# Patient Record
Sex: Male | Born: 1945 | Race: White | Hispanic: No | Marital: Married | Smoking: Never smoker
Health system: Southern US, Community
[De-identification: ages and names within clinical notes are randomized; demographics above are authoritative.]

## PROBLEM LIST (undated history)

## (undated) DIAGNOSIS — K449 Diaphragmatic hernia without obstruction or gangrene: Secondary | ICD-10-CM

## (undated) DIAGNOSIS — K649 Unspecified hemorrhoids: Secondary | ICD-10-CM

## (undated) DIAGNOSIS — E785 Hyperlipidemia, unspecified: Secondary | ICD-10-CM

## (undated) DIAGNOSIS — D509 Iron deficiency anemia, unspecified: Secondary | ICD-10-CM

## (undated) DIAGNOSIS — R739 Hyperglycemia, unspecified: Secondary | ICD-10-CM

## (undated) DIAGNOSIS — N320 Bladder-neck obstruction: Secondary | ICD-10-CM

## (undated) DIAGNOSIS — R634 Abnormal weight loss: Secondary | ICD-10-CM

## (undated) DIAGNOSIS — R740 Nonspecific elevation of levels of transaminase and lactic acid dehydrogenase [LDH]: Secondary | ICD-10-CM

## (undated) DIAGNOSIS — R7401 Elevation of levels of liver transaminase levels: Secondary | ICD-10-CM

## (undated) DIAGNOSIS — E669 Obesity, unspecified: Secondary | ICD-10-CM

## (undated) DIAGNOSIS — I10 Essential (primary) hypertension: Secondary | ICD-10-CM

## (undated) DIAGNOSIS — R1110 Vomiting, unspecified: Secondary | ICD-10-CM

## (undated) DIAGNOSIS — K760 Fatty (change of) liver, not elsewhere classified: Secondary | ICD-10-CM

## (undated) DIAGNOSIS — K219 Gastro-esophageal reflux disease without esophagitis: Secondary | ICD-10-CM

## (undated) DIAGNOSIS — R109 Unspecified abdominal pain: Secondary | ICD-10-CM

## (undated) DIAGNOSIS — K759 Inflammatory liver disease, unspecified: Secondary | ICD-10-CM

## (undated) DIAGNOSIS — R17 Unspecified jaundice: Secondary | ICD-10-CM

## (undated) DIAGNOSIS — M1990 Unspecified osteoarthritis, unspecified site: Secondary | ICD-10-CM

## (undated) DIAGNOSIS — I499 Cardiac arrhythmia, unspecified: Secondary | ICD-10-CM

## (undated) DIAGNOSIS — R6881 Early satiety: Secondary | ICD-10-CM

## (undated) DIAGNOSIS — K5790 Diverticulosis of intestine, part unspecified, without perforation or abscess without bleeding: Secondary | ICD-10-CM

## (undated) DIAGNOSIS — D751 Secondary polycythemia: Secondary | ICD-10-CM

## (undated) DIAGNOSIS — R1310 Dysphagia, unspecified: Secondary | ICD-10-CM

## (undated) HISTORY — DX: Essential (primary) hypertension: I10

## (undated) HISTORY — DX: Diverticulosis of intestine, part unspecified, without perforation or abscess without bleeding: K57.90

## (undated) HISTORY — DX: Vomiting, unspecified: R11.10

## (undated) HISTORY — DX: Bladder-neck obstruction: N32.0

## (undated) HISTORY — PX: CYST REMOVAL HAND: SHX6279

## (undated) HISTORY — DX: Obesity, unspecified: E66.9

## (undated) HISTORY — PX: CATARACT EXTRACTION, BILATERAL: SHX1313

## (undated) HISTORY — PX: OTHER SURGICAL HISTORY: SHX169

## (undated) HISTORY — DX: Abnormal weight loss: R63.4

## (undated) HISTORY — DX: Inflammatory liver disease, unspecified: K75.9

## (undated) HISTORY — PX: APPENDECTOMY: SHX54

## (undated) HISTORY — DX: Secondary polycythemia: D75.1

## (undated) HISTORY — DX: Unspecified jaundice: R17

## (undated) HISTORY — DX: Unspecified abdominal pain: R10.9

## (undated) HISTORY — PX: CHOLECYSTECTOMY: SHX55

## (undated) HISTORY — PX: TRANSURETHRAL RESECTION OF PROSTATE: SHX73

## (undated) HISTORY — PX: UPPER GASTROINTESTINAL ENDOSCOPY: SHX188

## (undated) HISTORY — DX: Early satiety: R68.81

## (undated) HISTORY — DX: Diaphragmatic hernia without obstruction or gangrene: K44.9

## (undated) HISTORY — PX: COLON RESECTION: SHX5231

## (undated) HISTORY — DX: Dysphagia, unspecified: R13.10

## (undated) HISTORY — DX: Hyperlipidemia, unspecified: E78.5

## (undated) HISTORY — DX: Hyperglycemia, unspecified: R73.9

## (undated) HISTORY — DX: Iron deficiency anemia, unspecified: D50.9

## (undated) HISTORY — DX: Nonspecific elevation of levels of transaminase and lactic acid dehydrogenase (ldh): R74.0

## (undated) HISTORY — DX: Unspecified hemorrhoids: K64.9

## (undated) HISTORY — DX: Fatty (change of) liver, not elsewhere classified: K76.0

## (undated) HISTORY — DX: Elevation of levels of liver transaminase levels: R74.01

## (undated) HISTORY — DX: Gastro-esophageal reflux disease without esophagitis: K21.9

---

## 2002-07-12 HISTORY — PX: SHOULDER OPEN ROTATOR CUFF REPAIR: SHX2407

## 2012-07-24 HISTORY — PX: COLONOSCOPY: SHX174

## 2013-12-27 ENCOUNTER — Ambulatory Visit (INDEPENDENT_AMBULATORY_CARE_PROVIDER_SITE_OTHER): Payer: Medicare Other | Admitting: Internal Medicine

## 2013-12-27 ENCOUNTER — Encounter (INDEPENDENT_AMBULATORY_CARE_PROVIDER_SITE_OTHER): Payer: Self-pay | Admitting: *Deleted

## 2013-12-27 ENCOUNTER — Encounter (INDEPENDENT_AMBULATORY_CARE_PROVIDER_SITE_OTHER): Payer: Self-pay | Admitting: Internal Medicine

## 2013-12-27 VITALS — BP 108/72 | HR 76 | Temp 98.1°F | Resp 18 | Ht 75.0 in | Wt 226.6 lb

## 2013-12-27 DIAGNOSIS — K7689 Other specified diseases of liver: Secondary | ICD-10-CM

## 2013-12-27 DIAGNOSIS — R7401 Elevation of levels of liver transaminase levels: Secondary | ICD-10-CM

## 2013-12-27 DIAGNOSIS — R740 Nonspecific elevation of levels of transaminase and lactic acid dehydrogenase [LDH]: Secondary | ICD-10-CM

## 2013-12-27 NOTE — Patient Instructions (Signed)
Liver biopsy as scheduled. Physician will call with results of liver biopsy and further recommendations.

## 2013-12-27 NOTE — Progress Notes (Addendum)
Presenting complaint;  Elevated transaminases.  History of present illness;  Patient is 68 year old Caucasian male who is referred to courtesy of Dr. Drue Stager of [place], [place] for GI evaluation and possible ERCP. Patient is accompanied by his wife today. Patient has history of polycythemia vera and last month and to see his oncologist for scheduled visit. He had lab studies and his LFTs were abnormal. He was therefore referred for GI evaluation. He was seen by Dr.Spainhour. LFTs were repeated along with ultrasound followed by CT and finally MRCP. He was noted to have wall thickening to CBD and CHD along with narrowing as well as segmental narrowing to intrahepatic ducts with focal dilation. Patient states he hasn't felt well for the past few months. He has had extreme fatigue. He is been nauseated intermittently but never vomited. He also has been noticing pruritus over the last 3 months. He says his stool is lighter in color at times causes no change in urine color although he thinks he may be passing less urine. He has noted decrease in his appetite and has lost 5 pounds. He also is noted bloating and heaviness or tightness in right upper quadrant. He has had night sweats but denies fever or chills. 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 He had blood work in September last year revealing positive core antibody and negative hepatitis B surface antigen. Does not take any herbal medications. He was on aspirin and omega III fish oil which was stopped over 2 weeks ago. Heartburn is well-controlled with PPI.  Current Medications: Outpatient Encounter Prescriptions as of 12/27/2013  Medication Sig  . Cholecalciferol (VITAMIN D3) 1000 UNITS CAPS Take by mouth daily.  . Cyanocobalamin (NASCOBAL) 500 MCG/0.1ML SOLN Place into the nose once a week.  Marland Kitchen dexlansoprazole (DEXILANT) 60 MG capsule Take 60 mg by mouth daily.  . ferrous sulfate 325 (65 FE) MG EC  tablet Take 325 mg by mouth. Patient takes this by mouth every other day  . nebivolol (BYSTOLIC) 2.5 MG tablet Take 2.5 mg by mouth daily.  . tamsulosin (FLOMAX) 0.4 MG CAPS capsule Take 0.4 mg by mouth daily.  Marland Kitchen aspirin 81 MG tablet Take 81 mg by mouth daily.  . Omega-3 Fatty Acids (FISH OIL) 1000 MG CAPS Take by mouth daily.   Past medical history; Chronic GERD. Status post repair for hiatal hernia at age 45. He also had esophagus dilated at the same time. Hypertension. Hyperlipidemia. History of colonic polyps; last colonoscopy January 2014 revealing diverticulosis. Polycythemia vera diagnosed 10 years ago; requiring periodic phlebotomies. History of hepatitis B at age 61; hepatitis B surface antigen negative and hepatitis B core antibody total positive September 2014 Hyperglycemia. History of B12 and iron deficiency. History of benign positional vertigo. Right knee arthritis. Status post surgery at age 47 and more recently arthroscopy last year. Pilonidal cyst removed at age 57. Appendectomy at age 11. Right shoulder surgery for rotator cuff repair at age 44. Status post resection of sigmoid colon for diverticulitis at age 58. Cholecystectomy for cholelithiasis at age 59. Right cataract extraction at age 20 and left one in 50. Laser surgery for BPH at age 72.  Allergies; Allergies  Allergen Reactions  . Biaxin [Clarithromycin]     Sick Reaction Type: Adverse Allergy  . Vytorin [Ezetimibe-Simvastatin]     Myalgias Reaction type   Family history; Father died of alcoholic cirrhosis at age 64. Mother died of COPD at age 68. Sister died last week  at age 59 secondary to [place]).  Social history; Patient is married and accompanied by his wife today. They have two grown up children. He works as an Paramedic from the knee and retired in 1996. He has never smoked cigarettes and drinks alcohol occasionally.   Physical examination; Blood pressure 108/72, pulse 76,  temperature 98.1 F (36.7 C), temperature source Oral, resp. rate 18, height 6\' 3"  (1.905 m), weight 226 lb 9.6 oz (102.785 kg). Patient is alert and does not have asterixis. Conjunctiva is pink. Sclera is nonicteric Oropharyngeal mucosa is normal. No neck masses or thyromegaly noted. Cardiac exam with regular rhythm normal S1 and S2. No murmur or gallop noted. Lungs are clear to auscultation. Abdomen is full. Bowel sounds are normal. On palpation abdomen is soft. Liver edge is 7 cm below RCM; it is firm and mildly tender; span is about 16 cm. Spleen is not palpable. No LE edema or clubbing noted.  Labs/studies Results: LFTs at 06/05/2013. Bilirubin 0.9, AP 101, AST 42, ALT 74 and albumin 3.8  LFTs on 09/10/2013 Bilirubin 1.0, AP 159, AST 48, ALT 108 and albumin 3.7  LFTs on 12/04/2013 Bilirubin 1.0, AP 399, AST 92, ALT 111 and albumin 3.2.  LFTs on 12/18/2013 Bili 1.1, AP 370, AST 134, ALT 101 and albumin 3.3.  Lab data on 12/04/2013 Serum sodium 140, potassium 4.3, chloride 106, CO2 29, BUN 15 and creatinine 0.92 Glucose 140 and serum calcium 8.6 Serum ferritin 62.7 WBC 5.8, H&H 15.2 and 48.1 and platelet count 245K.  Lab data on 12/24/2013(studies were ordered by me). Sed rate 6 AMA 4.9 which is negative ANA negative smooth muscle antibody 11 which is within normal limits IgG subclass 4   96 mg/dL which is within normal limits  Imaging studies;     [place] on 12/10/2013.       Liver measured 19 cm and revealed increased                       echogenecity.      CBD 27mm. gallbladder has been removed.    Abdominal CT with contrast on 12/14/2013.       No abnormality noted 2 liver; slight prominence to the              intrahepatic biliary radicals but normal size CBD.        Moderate-sized sliding hiatal hernia and bilateral renal             parapelvic cysts.   MRI abdomen without contrast on 12/19/2013.        Liver size is normal. Slight prominence noted to                        intrahepatic bile ducts. Common hepatic duct and                   common bile duct shows mild diffuse narrowing. There           is suggestion of thickening throughout the wall of                      common hepatic and bile duct measuring 4 mm the                 distally CBD measures 7 mm at the level of pancreas.            Small to moderate size  sliding hiatal hernia.      I have reviewed and MRCP with Dr. Lavonia Dana. There is         focal dilation and narrowing to intrahepatic biliary radicals      in addition to above   Assessment:  Patient is 68 year old Caucasian male who presents with  few months history of fatigue nausea pruritus dull pain at right upper quadrant and elevated transaminases with cholestatic pattern. He has remote history of hepatitis B and markers last September confirmed recovery with immunity. Imaging studies reveal diffuse narrowing to extrahepatic bile duct as well as focal dilation and narrowing to intrahepatic ducts. He also has evidence of wall thickening to intra-and extrahepatic biliary system. Biochemical markers for autoimmune process are negative. Patient's presentation nevertheless is worrisome for autoimmune cholangitis or nonsuppurative cholangitis(PBC). Similarly sarcoidosis would be in differential diagnosis but he has no evidence of lymphadenopathy. Other possibilities would include [place] but he does not have inflammatory bowel disease as well as drug-induced biliary tract disease. I do not believe ERCP would provide critical information as to the diagnosis and its management. Therefore I would recommend liver biopsy in an effort to make definite diagnosis. He has history of polycythemia vera requiring periodic phlebotomies. I doubt that there is an association between this condition and bile duct disease.    Recommendations;  Ultrasound-guided liver biopsy on 12/31/2013. Further recommendations will be made as soon as liver biopsy results are  available.

## 2013-12-28 ENCOUNTER — Encounter (HOSPITAL_COMMUNITY): Payer: Self-pay | Admitting: Pharmacist

## 2013-12-28 ENCOUNTER — Other Ambulatory Visit: Payer: Self-pay | Admitting: Radiology

## 2013-12-28 DIAGNOSIS — K219 Gastro-esophageal reflux disease without esophagitis: Secondary | ICD-10-CM | POA: Insufficient documentation

## 2013-12-28 DIAGNOSIS — D45 Polycythemia vera: Secondary | ICD-10-CM | POA: Insufficient documentation

## 2013-12-28 DIAGNOSIS — I10 Essential (primary) hypertension: Secondary | ICD-10-CM | POA: Insufficient documentation

## 2013-12-28 DIAGNOSIS — K831 Obstruction of bile duct: Secondary | ICD-10-CM | POA: Insufficient documentation

## 2013-12-31 ENCOUNTER — Ambulatory Visit (HOSPITAL_COMMUNITY): Payer: Medicare Other

## 2013-12-31 ENCOUNTER — Ambulatory Visit (HOSPITAL_COMMUNITY): Admission: RE | Admit: 2013-12-31 | Payer: Medicare Other | Source: Ambulatory Visit

## 2013-12-31 ENCOUNTER — Ambulatory Visit (HOSPITAL_COMMUNITY)
Admission: RE | Admit: 2013-12-31 | Discharge: 2013-12-31 | Disposition: A | Discharge status: Home / Self Care | Payer: Medicare Other | Source: Ambulatory Visit | Attending: Internal Medicine | Admitting: Internal Medicine

## 2013-12-31 ENCOUNTER — Encounter (HOSPITAL_COMMUNITY): Payer: Self-pay

## 2013-12-31 DIAGNOSIS — R7402 Elevation of levels of lactic acid dehydrogenase (LDH): Secondary | ICD-10-CM | POA: Insufficient documentation

## 2013-12-31 DIAGNOSIS — R7309 Other abnormal glucose: Secondary | ICD-10-CM | POA: Insufficient documentation

## 2013-12-31 DIAGNOSIS — K7689 Other specified diseases of liver: Secondary | ICD-10-CM | POA: Insufficient documentation

## 2013-12-31 DIAGNOSIS — R740 Nonspecific elevation of levels of transaminase and lactic acid dehydrogenase [LDH]: Principal | ICD-10-CM

## 2013-12-31 DIAGNOSIS — K5710 Diverticulosis of small intestine without perforation or abscess without bleeding: Secondary | ICD-10-CM | POA: Insufficient documentation

## 2013-12-31 DIAGNOSIS — I10 Essential (primary) hypertension: Secondary | ICD-10-CM | POA: Insufficient documentation

## 2013-12-31 DIAGNOSIS — R748 Abnormal levels of other serum enzymes: Secondary | ICD-10-CM | POA: Insufficient documentation

## 2013-12-31 DIAGNOSIS — Z7982 Long term (current) use of aspirin: Secondary | ICD-10-CM | POA: Insufficient documentation

## 2013-12-31 DIAGNOSIS — E785 Hyperlipidemia, unspecified: Secondary | ICD-10-CM | POA: Insufficient documentation

## 2013-12-31 DIAGNOSIS — D45 Polycythemia vera: Secondary | ICD-10-CM | POA: Insufficient documentation

## 2013-12-31 DIAGNOSIS — K5730 Diverticulosis of large intestine without perforation or abscess without bleeding: Secondary | ICD-10-CM | POA: Insufficient documentation

## 2013-12-31 DIAGNOSIS — K219 Gastro-esophageal reflux disease without esophagitis: Secondary | ICD-10-CM | POA: Insufficient documentation

## 2013-12-31 DIAGNOSIS — K759 Inflammatory liver disease, unspecified: Secondary | ICD-10-CM | POA: Insufficient documentation

## 2013-12-31 DIAGNOSIS — K449 Diaphragmatic hernia without obstruction or gangrene: Secondary | ICD-10-CM | POA: Insufficient documentation

## 2013-12-31 DIAGNOSIS — D509 Iron deficiency anemia, unspecified: Secondary | ICD-10-CM | POA: Insufficient documentation

## 2013-12-31 DIAGNOSIS — E669 Obesity, unspecified: Secondary | ICD-10-CM | POA: Insufficient documentation

## 2013-12-31 DIAGNOSIS — N320 Bladder-neck obstruction: Secondary | ICD-10-CM | POA: Insufficient documentation

## 2013-12-31 DIAGNOSIS — Z8619 Personal history of other infectious and parasitic diseases: Secondary | ICD-10-CM | POA: Insufficient documentation

## 2013-12-31 DIAGNOSIS — R7401 Elevation of levels of liver transaminase levels: Secondary | ICD-10-CM | POA: Insufficient documentation

## 2013-12-31 HISTORY — PX: LIVER BIOPSY: SHX301

## 2013-12-31 LAB — PROTIME-INR
INR: 1.01 (ref 0.00–1.49)
Prothrombin Time: 13.1 seconds (ref 11.6–15.2)

## 2013-12-31 LAB — CBC
HCT: 49.6 % (ref 39.0–52.0)
Hemoglobin: 16.9 g/dL (ref 13.0–17.0)
MCH: 30 pg (ref 26.0–34.0)
MCHC: 34.1 g/dL (ref 30.0–36.0)
MCV: 88.1 fL (ref 78.0–100.0)
Platelets: 236 10*3/uL (ref 150–400)
RBC: 5.63 MIL/uL (ref 4.22–5.81)
RDW: 13.7 % (ref 11.5–15.5)
WBC: 6.3 10*3/uL (ref 4.0–10.5)

## 2013-12-31 LAB — BASIC METABOLIC PANEL
BUN: 16 mg/dL (ref 6–23)
CALCIUM: 9.5 mg/dL (ref 8.4–10.5)
CO2: 23 mEq/L (ref 19–32)
CREATININE: 0.88 mg/dL (ref 0.50–1.35)
Chloride: 100 mEq/L (ref 96–112)
GFR calc non Af Amer: 85 mL/min — ABNORMAL LOW (ref >90)
Glucose, Bld: 115 mg/dL — ABNORMAL HIGH (ref 70–99)
Potassium: 4.2 mEq/L (ref 3.7–5.3)
Sodium: 139 mEq/L (ref 137–147)

## 2013-12-31 LAB — APTT: APTT: 27 s (ref 24–37)

## 2013-12-31 MED ORDER — MIDAZOLAM HCL 2 MG/2ML IJ SOLN
INTRAMUSCULAR | Status: AC | PRN
  Administered 2013-12-31: 2 mg via INTRAVENOUS

## 2013-12-31 MED ORDER — SODIUM CHLORIDE 0.9 % IV SOLN
INTRAVENOUS | Status: DC
  Administered 2013-12-31: 13:00:00 via INTRAVENOUS

## 2013-12-31 MED ORDER — FENTANYL CITRATE 0.05 MG/ML IJ SOLN
INTRAMUSCULAR | Status: DC
Start: 2013-12-31 — End: 2014-01-01
  Filled 2013-12-31: qty 4

## 2013-12-31 MED ORDER — MIDAZOLAM HCL 2 MG/2ML IJ SOLN
INTRAMUSCULAR | Status: AC
  Filled 2013-12-31: qty 4

## 2013-12-31 MED ORDER — FENTANYL CITRATE 0.05 MG/ML IJ SOLN
INTRAMUSCULAR | Status: AC | PRN
Start: 2013-12-31 — End: 2013-12-31
  Administered 2013-12-31: 50 ug via INTRAVENOUS

## 2013-12-31 MED ORDER — HYDROCODONE-ACETAMINOPHEN 5-325 MG PO TABS: 1.0000 | ORAL_TABLET | ORAL | Status: DC | PRN

## 2013-12-31 NOTE — Discharge Instructions (Signed)
Liver Biopsy, Care After Refer to this sheet in the next few weeks. These discharge instructions provide you with general information on caring for yourself after you leave the hospital. Your caregiver may also give you specific instructions. Your treatment has been planned according to the most current medical practices available, but unavoidable complications sometimes occur. If you have any problems or questions after discharge, please call your caregiver. HOME CARE INSTRUCTIONS   You should rest for 1 to 2 days or as instructed.  If you go home the same day as your procedure (outpatient), have a responsible adult take you home and stay with you overnight.  Do not lift more than 5 pounds or play contact sports for 2 weeks.  Do not drive for 24 hours after this test.  Do not take medicine containing aspirin or drink alcohol for 1 week after this test.  Change bandages (dressings) as directed.  Only take over-the-counter or prescription medicines for pain, discomfort, or fever as directed by your caregiver. OBTAINING YOUR TEST RESULTS Not all test results are available during your visit. If your test results are not back during the visit, make an appointment with your caregiver to find out the results. Do not assume everything is normal if you have not heard from your caregiver or the medical facility. It is important for you to follow up on all of your test results. SEEK MEDICAL CARE IF:   You have increased bleeding (more than a small spot) from the biopsy site.  You have redness, swelling, or increasing pain in the biopsy site.  You have an oral temperature above 102 F (38.9 C). SEEK IMMEDIATE MEDICAL CARE IF:   You develop swelling or pain in the belly (abdomen).  You develop a rash.  You have difficulty breathing, feel short of breath, or feel faint.  You develop any reaction or side effects to medicines given. MAKE SURE YOU:   Understand these instructions.  Will watch  your condition.  Will get help right away if you are not doing well or get worse. Document Released: 01/15/2005 Document Revised: 07/03/2013 Document Reviewed: 02/08/2008 [facility] Patient Information 2015 [place], [place]. This information is not intended to replace advice given to you by your health care provider. Make sure you discuss any questions you have with your health care provider.

## 2013-12-31 NOTE — Procedures (Signed)
US liver core 18g x3 to surg path No complication No blood loss. See complete dictation in [facility] PACS.  

## 2013-12-31 NOTE — H&P (Signed)
Chief Complaint: "I'm here for a liver biopsy" Referring Physician:Rehman HPI: Gregory Patrick is an 68 y.o. male with elevated liver enzymes of uncertain etiology. He is referred for US guided random liver core biopsy. PMHx and meds reviewed. Pt feels ok, has been NPO  Past Medical History:  Past Medical History  Diagnosis Date  . Hyperlipemia   . Hyperglycemia   . Polycythemia   . Iron deficiency anemia   . Erythrocytosis   . Fatty liver   . Elevated transaminase level   . GERD (gastroesophageal reflux disease)   . Hiatal hernia   . Weight loss   . Dysphagia   . Diverticulosis     Of Colon,and small intestine  . Obesity   . Bladder outlet obstruction   . Hemorrhoids   . Hypertension     Beign  . Abdominal pain   . Early satiety   . Vomiting   . Jaundice   . Hepatitis     Hepatitis B @ age 19    Past Surgical History:  Past Surgical History  Procedure Laterality Date  . Colonoscopy  07/24/12    Diverticulosis  . Appendectomy    . Cholecystectomy    . Cataract extraction, bilateral    . Colon resection      diverticulitis  . Arthroscopic knee Right   . Transurethral resection of prostate    . Upper gastrointestinal endoscopy      At age 74  . Vertigo      Benign Positional    Family History:  Family History  Problem Relation Age of Onset  . Pulmonary embolism Mother   . Alcohol abuse Father   . Diabetes Sister   . Hypertension Sister   . Healthy Daughter   . Healthy Son     Social History:  reports that he has never smoked. He has never used smokeless tobacco. He reports that he drinks alcohol. His drug history is not on file.  Allergies:  Allergies  Allergen Reactions  . Vytorin [Ezetimibe-Simvastatin] Other (See Comments)    Elevated LFTs  . Biaxin [Clarithromycin] Nausea And Vomiting    Medications:   Medication List    ASK your doctor about these medications       aspirin EC 81 MG tablet  Take 81 mg by mouth every morning.     dexlansoprazole 60 MG capsule  Commonly known as:  DEXILANT  Take 60 mg by mouth every morning.     ferrous sulfate 325 (65 FE) MG EC tablet  Take 325 mg by mouth every other day.     Fish Oil 1000 MG Caps  Take 1,000 mg by mouth every morning.     FLOMAX 0.4 MG Caps capsule  Generic drug:  tamsulosin  Take 0.4 mg by mouth every morning.     NASCOBAL 500 MCG/0.1ML Soln  Generic drug:  Cyanocobalamin  Place 500 mcg into the nose every Sunday.     nebivolol 2.5 MG tablet  Commonly known as:  BYSTOLIC  Take 2.5 mg by mouth every morning.     Vitamin D3 1000 UNITS Caps  Take 1,000 Units by mouth every morning.        Please HPI for pertinent positives, otherwise complete 10 system ROS negative.  Physical Exam: BP 129/84  Pulse 66  Temp(Src) 98 F (36.7 C) (Oral)  Resp 20  Ht 6\' 3"  (1.905 m)  Wt 230 lb (104.327 kg)  BMI 28.75 kg/m2  SpO2 97% Body mass index  is 28.75 kg/(m^2).   General Appearance:  Alert, cooperative, no distress, appears stated age  Head:  Normocephalic, without obvious abnormality, atraumatic  ENT: Unremarkable  Neck: Supple, symmetrical, trachea midline  Lungs:   Clear to auscultation bilaterally, no w/r/r, respirations unlabored without use of accessory muscles.  Heart:  Regular rate and rhythm, S1, S2 normal, no murmur, rub or gallop.  Abdomen:   Soft, non-tender, non distended.  Neurologic: Normal affect, no gross deficits.   Results for orders placed during the hospital encounter of 12/31/13 (from the past 48 hour(s))  APTT     Status: None   Collection Time    12/31/13  1:02 PM      Result Value Ref Range   aPTT 27  24 - 37 seconds  CBC     Status: None   Collection Time    12/31/13  1:02 PM      Result Value Ref Range   WBC 6.3  4.0 - 10.5 K/uL   RBC 5.63  4.22 - 5.81 MIL/uL   Hemoglobin 16.9  13.0 - 17.0 g/dL   HCT 49.6  39.0 - 52.0 %   MCV 88.1  78.0 - 100.0 fL   MCH 30.0  26.0 - 34.0 pg   MCHC 34.1  30.0 - 36.0 g/dL   RDW  13.7  11.5 - 15.5 %   Platelets 236  150 - 400 K/uL  PROTIME-INR     Status: None   Collection Time    12/31/13  1:02 PM      Result Value Ref Range   Prothrombin Time 13.1  11.6 - 15.2 seconds   INR 1.01  0.00 - 1.49   No results found.  Assessment/Plan Elevated liver enzymes. For US guided liver biopsy Discussed procedure, risks, complications, use of sedation Labs reviewed. Consent signed in chart  Ascencion Dike PA-C 12/31/2013, 1:39 PM

## 2014-01-01 ENCOUNTER — Other Ambulatory Visit (INDEPENDENT_AMBULATORY_CARE_PROVIDER_SITE_OTHER): Payer: Self-pay | Admitting: Internal Medicine

## 2014-01-01 MED ORDER — URSODIOL 300 MG PO CAPS: 300.0000 mg | ORAL_CAPSULE | Freq: Three times a day (TID) | ORAL | Status: DC

## 2014-01-02 ENCOUNTER — Other Ambulatory Visit (INDEPENDENT_AMBULATORY_CARE_PROVIDER_SITE_OTHER): Payer: Self-pay | Admitting: Internal Medicine

## 2014-01-03 ENCOUNTER — Other Ambulatory Visit (INDEPENDENT_AMBULATORY_CARE_PROVIDER_SITE_OTHER): Payer: Self-pay | Admitting: Internal Medicine

## 2014-01-03 DIAGNOSIS — R7401 Elevation of levels of liver transaminase levels: Secondary | ICD-10-CM

## 2014-01-03 DIAGNOSIS — R740 Nonspecific elevation of levels of transaminase and lactic acid dehydrogenase [LDH]: Principal | ICD-10-CM

## 2014-01-04 ENCOUNTER — Encounter (HOSPITAL_COMMUNITY): Payer: Self-pay

## 2014-01-07 ENCOUNTER — Other Ambulatory Visit (INDEPENDENT_AMBULATORY_CARE_PROVIDER_SITE_OTHER): Payer: Self-pay | Admitting: *Deleted

## 2014-01-07 ENCOUNTER — Encounter (HOSPITAL_COMMUNITY): Payer: Self-pay | Admitting: Pharmacy Technician

## 2014-01-07 ENCOUNTER — Telehealth (INDEPENDENT_AMBULATORY_CARE_PROVIDER_SITE_OTHER): Payer: Self-pay | Admitting: *Deleted

## 2014-01-07 DIAGNOSIS — R748 Abnormal levels of other serum enzymes: Secondary | ICD-10-CM

## 2014-01-07 DIAGNOSIS — Z8719 Personal history of other diseases of the digestive system: Secondary | ICD-10-CM

## 2014-01-07 NOTE — Telephone Encounter (Signed)
Patient is scheduled   Called [employer] per Melissa no precert needed for this procedure.    ERCP scheduled on 01/09/2014 to arrive at 8:30 for a 10:30 procedure time.  His pre-op visit is 01/08/2014 at 1:45.  He is to take his meds to show nurse.  Also asked they take just a small overnight bag just in case of overnight stay but doubt they would need it.  NPO tomorrow night.  All information was asked by patient to give to his wife.

## 2014-01-07 NOTE — Telephone Encounter (Signed)
Dr.Rehman called and ask that Gregory Patrick be set up for ERCP on Wednesday July 1 st.

## 2014-01-08 ENCOUNTER — Encounter (HOSPITAL_COMMUNITY)
Admission: RE | Admit: 2014-01-08 | Discharge: 2014-01-08 | Disposition: A | Discharge status: Home / Self Care | Payer: Medicare Other | Source: Ambulatory Visit | Attending: Internal Medicine | Admitting: Internal Medicine

## 2014-01-08 ENCOUNTER — Other Ambulatory Visit: Payer: Self-pay

## 2014-01-08 ENCOUNTER — Encounter (HOSPITAL_COMMUNITY): Payer: Self-pay

## 2014-01-08 HISTORY — DX: Unspecified osteoarthritis, unspecified site: M19.90

## 2014-01-08 HISTORY — DX: Cardiac arrhythmia, unspecified: I49.9

## 2014-01-08 NOTE — Patient Instructions (Addendum)
Gregory Patrick  01/08/2014   Your procedure is scheduled on:  01/09/14  Report to [facility] at 9:05 AM.  Call this number if you have problems the morning of surgery: [phone]   Remember:   Do not eat food or drink liquids after midnight.   Take these medicines the morning of surgery with A SIP OF WATER: Bystolic and Dexilant   Endoscopic Retrograde Cholangiopancreatography (ERCP), Care After Refer to this sheet in the next few weeks. These instructions provide you with information on caring for yourself after your procedure. Your health care Gregory Patrick may also give you more specific instructions. Your treatment has been planned according to current medical practices, but problems sometimes occur. Call your health care Gregory Patrick if you have any problems or questions after your procedure.  WHAT TO EXPECT AFTER THE PROCEDURE  After your procedure, it is typical to feel:   Soreness in your throat.   Sick to your stomach (nauseous).   Bloated.  Dizzy.   Fatigued. HOME CARE INSTRUCTIONS  Have a friend or family member stay with you for the first 24 hours after your procedure.  Start taking your usual medicines and eating normally as soon as you feel well enough to do so or as directed by your health care Gregory Patrick. SEEK MEDICAL CARE IF:  You have abdominal pain.   You develop signs of infection, such as:   Chills.   Feeling unwell.  SEEK IMMEDIATE MEDICAL CARE IF:  You have difficulty swallowing.  You have worsening throat, chest, or abdominal pain.  You vomit.  You have bloody or very black stools.  You have a fever. Document Released: 04/18/2013 Document Reviewed: 04/18/2013 Gregory Patrick Patient Information 2015 [place], Gregory Patrick. This information is not intended to replace advice given to you by your health care Gregory Patrick. Make sure you discuss any questions you have with your health care Gregory Patrick. Endoscopic Retrograde Cholangiopancreatography (ERCP) Endoscopic  retrograde cholangiopancreatography (ERCP) is a procedure used to diagnosis many diseases of the pancreas, bile ducts, liver, and gallbladder. During ERCP a thin, lighted tube (endoscope) is passed through the mouth and down the back of the throat into the first part of the small intestine (duodenum). A small, plastic tube (cannula) is then passed through the endoscope and directed into the bile duct or pancreatic duct. Dye is then injected through the cannula and X-rays are taken to study the biliary and pancreatic passageways.  LET Gregory Patrick CARE Gregory Patrick KNOW ABOUT:   Any allergies you have.   All medicines you are taking, including vitamins, herbs, eyedrops, creams, and over-the-counter medicines.   Previous problems you or members of your family have had with the use of anesthetics.   Any blood disorders you have.   Previous surgeries you have had.   Medical conditions you have. RISKS AND COMPLICATIONS Generally, ERCP is a safe procedure. However, as with any procedure, complications can occur. A simple removal of gallstones has the lowest rate of complications. Higher rates of complication occur in people who have poorly functioning bile or pancreatic ducts. Possible complications include:   Pancreatitis.  Bleeding.  Accidental punctures in the bowel wall, pancreas, or gall bladder.  Gall bladder or bile duct infection. BEFORE THE PROCEDURE   Do not eat or drink anything, including water, for at least 8 hours before the procedure or as directed by your health care Gregory Patrick.   Ask your health care Gregory Patrick whether you should stop taking certain medicines prior to your procedure.   Arrange for someone  to drive you home. You will not be allowed to drive for 13-08 hours after the procedure. PROCEDURE   You will be given medicine through a vein (intravenously) to make you relaxed and sleepy.   You might have a breathing tube placed to give you medicine that makes you  sleep (general anesthetic).   Your throat may be sprayed with medicine that numbs the area and prevents gagging (local anesthetic), or you may gargle this medicine.   You will lie on your left side.   The endoscope will be inserted through your mouth and into the duodenum. The tube will not interfere with your breathing. Gagging is prevented by the anesthesia.   While X-rays are being taken, you may be positioned on your stomach.   A small sample of tissue (biopsy) may be removed for examination. AFTER THE PROCEDURE   You will rest in bed until you are fully conscious.   When you first wake up, your throat may feel slightly sore.   You will not be allowed to eat or drink until numbness subsides.   Once you are able to drink, urinate, and sit on the edge of the bed without feeling sick to your stomach (nauseous) or dizzy, you may be allowed to go home. Document Released: 03/23/2001 Document Revised: 04/18/2013 Document Reviewed: 02/06/2013 Gregory Patrick Patient Information 2015 Gregory Patrick, Gregory Patrick. This information is not intended to replace advice given to you by your health care Lovis More. Make sure you discuss any questions you have with your health care Ginger Leeth.    Do not wear jewelry, make-up or nail polish.  Do not wear lotions, powders, or perfumes. You may wear deodorant.  Do not shave 48 hours prior to surgery. Men may shave face and neck.  Do not bring valuables to the [facility].  [facility] is not responsible  for any belongings or valuables.               Contacts, dentures or bridgework may not be worn into surgery.  Leave suitcase in the car. After surgery it may be brought to your room.  For patients admitted to the [facility], discharge time is determined by your                treatment team.               Patients discharged the day of surgery will not be allowed to drive  home.  Name and phone number of your driver: family  Special Instructions: N/A   Please read  over the following fact sheets that you were given: Pain Booklet, Anesthesia Post-op Instructions and Care and Recovery After Surgery

## 2014-01-09 ENCOUNTER — Encounter (HOSPITAL_COMMUNITY): Payer: Self-pay | Admitting: *Deleted

## 2014-01-09 ENCOUNTER — Ambulatory Visit (HOSPITAL_COMMUNITY): Payer: Medicare Other | Admitting: Anesthesiology

## 2014-01-09 ENCOUNTER — Encounter (HOSPITAL_COMMUNITY)
Admission: RE | Disposition: A | Discharge status: Home / Self Care | Payer: Self-pay | Source: Ambulatory Visit | Attending: Internal Medicine

## 2014-01-09 ENCOUNTER — Encounter (HOSPITAL_COMMUNITY): Payer: Medicare Other | Admitting: Anesthesiology

## 2014-01-09 ENCOUNTER — Ambulatory Visit (HOSPITAL_COMMUNITY)
Admission: RE | Admit: 2014-01-09 | Discharge: 2014-01-09 | Disposition: A | Discharge status: Home / Self Care | Payer: Medicare Other | Source: Ambulatory Visit | Attending: Internal Medicine | Admitting: Internal Medicine

## 2014-01-09 ENCOUNTER — Ambulatory Visit (HOSPITAL_COMMUNITY): Payer: Medicare Other

## 2014-01-09 DIAGNOSIS — K769 Liver disease, unspecified: Secondary | ICD-10-CM | POA: Insufficient documentation

## 2014-01-09 DIAGNOSIS — R7402 Elevation of levels of lactic acid dehydrogenase (LDH): Secondary | ICD-10-CM | POA: Insufficient documentation

## 2014-01-09 DIAGNOSIS — E669 Obesity, unspecified: Secondary | ICD-10-CM | POA: Insufficient documentation

## 2014-01-09 DIAGNOSIS — E785 Hyperlipidemia, unspecified: Secondary | ICD-10-CM | POA: Insufficient documentation

## 2014-01-09 DIAGNOSIS — K2289 Other specified disease of esophagus: Secondary | ICD-10-CM

## 2014-01-09 DIAGNOSIS — R7401 Elevation of levels of liver transaminase levels: Secondary | ICD-10-CM | POA: Insufficient documentation

## 2014-01-09 DIAGNOSIS — R748 Abnormal levels of other serum enzymes: Secondary | ICD-10-CM | POA: Insufficient documentation

## 2014-01-09 DIAGNOSIS — K831 Obstruction of bile duct: Secondary | ICD-10-CM | POA: Insufficient documentation

## 2014-01-09 DIAGNOSIS — R1011 Right upper quadrant pain: Secondary | ICD-10-CM

## 2014-01-09 DIAGNOSIS — R740 Nonspecific elevation of levels of transaminase and lactic acid dehydrogenase [LDH]: Secondary | ICD-10-CM

## 2014-01-09 DIAGNOSIS — K2270 Barrett's esophagus without dysplasia: Secondary | ICD-10-CM | POA: Insufficient documentation

## 2014-01-09 DIAGNOSIS — Z8719 Personal history of other diseases of the digestive system: Secondary | ICD-10-CM

## 2014-01-09 DIAGNOSIS — K228 Other specified diseases of esophagus: Secondary | ICD-10-CM

## 2014-01-09 DIAGNOSIS — K838 Other specified diseases of biliary tract: Secondary | ICD-10-CM

## 2014-01-09 DIAGNOSIS — R112 Nausea with vomiting, unspecified: Secondary | ICD-10-CM

## 2014-01-09 DIAGNOSIS — Z79899 Other long term (current) drug therapy: Secondary | ICD-10-CM | POA: Insufficient documentation

## 2014-01-09 DIAGNOSIS — R945 Abnormal results of liver function studies: Secondary | ICD-10-CM

## 2014-01-09 DIAGNOSIS — K449 Diaphragmatic hernia without obstruction or gangrene: Secondary | ICD-10-CM

## 2014-01-09 DIAGNOSIS — I10 Essential (primary) hypertension: Secondary | ICD-10-CM | POA: Insufficient documentation

## 2014-01-09 DIAGNOSIS — K219 Gastro-esophageal reflux disease without esophagitis: Secondary | ICD-10-CM | POA: Insufficient documentation

## 2014-01-09 HISTORY — PX: BIOPSY: SHX5522

## 2014-01-09 HISTORY — PX: PANCREATIC STENT PLACEMENT: SHX5539

## 2014-01-09 HISTORY — PX: ERCP: SHX60

## 2014-01-09 HISTORY — PX: ERCP: SHX5425

## 2014-01-09 SURGERY — ERCP, WITH INTERVENTION IF INDICATED
Anesthesia: General

## 2014-01-09 MED ORDER — FENTANYL CITRATE 0.05 MG/ML IJ SOLN
INTRAMUSCULAR | Status: DC | PRN
  Administered 2014-01-09 (×2): 50 ug via INTRAVENOUS

## 2014-01-09 MED ORDER — PROPOFOL 10 MG/ML IV BOLUS
INTRAVENOUS | Status: DC | PRN
  Administered 2014-01-09: 40 mg via INTRAVENOUS
  Administered 2014-01-09: 20 mg via INTRAVENOUS
  Administered 2014-01-09: 140 mg via INTRAVENOUS

## 2014-01-09 MED ORDER — BUTAMBEN-TETRACAINE-BENZOCAINE 2-2-14 % EX AERO
1.0000 | INHALATION_SPRAY | Freq: Once | CUTANEOUS | Status: AC
  Administered 2014-01-09: 1 via TOPICAL
  Filled 2014-01-09: qty 56

## 2014-01-09 MED ORDER — ONDANSETRON HCL 4 MG/2ML IJ SOLN: 4.0000 mg | Freq: Once | INTRAMUSCULAR | Status: DC | PRN

## 2014-01-09 MED ORDER — INDOMETHACIN 50 MG RE SUPP
100.0000 mg | Freq: Once | RECTAL | Status: AC
  Administered 2014-01-09: 100 mg via RECTAL
  Filled 2014-01-09: qty 2

## 2014-01-09 MED ORDER — STERILE WATER FOR IRRIGATION IR SOLN
Status: DC | PRN
  Administered 2014-01-09: 11:00:00

## 2014-01-09 MED ORDER — GLYCOPYRROLATE 0.2 MG/ML IJ SOLN
0.2000 mg | Freq: Once | INTRAMUSCULAR | Status: AC
  Administered 2014-01-09: 0.2 mg via INTRAVENOUS

## 2014-01-09 MED ORDER — LACTATED RINGERS IV SOLN
INTRAVENOUS | Status: DC
  Administered 2014-01-09 (×2): via INTRAVENOUS

## 2014-01-09 MED ORDER — CEFAZOLIN SODIUM-DEXTROSE 2-3 GM-% IV SOLR
INTRAVENOUS | Status: AC
  Filled 2014-01-09: qty 50

## 2014-01-09 MED ORDER — MIDAZOLAM HCL 2 MG/2ML IJ SOLN
INTRAMUSCULAR | Status: AC
  Filled 2014-01-09: qty 2

## 2014-01-09 MED ORDER — GLYCOPYRROLATE 0.2 MG/ML IJ SOLN
INTRAMUSCULAR | Status: AC
  Filled 2014-01-09: qty 1

## 2014-01-09 MED ORDER — GLYCOPYRROLATE 0.2 MG/ML IJ SOLN
INTRAMUSCULAR | Status: AC
Start: 2014-01-09 — End: 2014-01-09
  Filled 2014-01-09: qty 1

## 2014-01-09 MED ORDER — LIDOCAINE HCL (PF) 1 % IJ SOLN
INTRAMUSCULAR | Status: AC
  Filled 2014-01-09: qty 5

## 2014-01-09 MED ORDER — ONDANSETRON HCL 4 MG/2ML IJ SOLN
INTRAMUSCULAR | Status: AC
  Filled 2014-01-09: qty 2

## 2014-01-09 MED ORDER — NEOSTIGMINE METHYLSULFATE 10 MG/10ML IV SOLN
INTRAVENOUS | Status: DC | PRN
  Administered 2014-01-09: 1 mg via INTRAVENOUS

## 2014-01-09 MED ORDER — SUCCINYLCHOLINE CHLORIDE 20 MG/ML IJ SOLN
INTRAMUSCULAR | Status: AC
  Filled 2014-01-09: qty 1

## 2014-01-09 MED ORDER — SODIUM CHLORIDE 0.9 % IV SOLN
INTRAVENOUS | Status: AC
  Filled 2014-01-09: qty 50

## 2014-01-09 MED ORDER — ROCURONIUM BROMIDE 50 MG/5ML IV SOLN
INTRAVENOUS | Status: AC
Start: 2014-01-09 — End: 2014-01-09
  Filled 2014-01-09: qty 1

## 2014-01-09 MED ORDER — PROPOFOL 10 MG/ML IV BOLUS
INTRAVENOUS | Status: AC
  Filled 2014-01-09: qty 20

## 2014-01-09 MED ORDER — GLYCOPYRROLATE 0.2 MG/ML IJ SOLN
INTRAMUSCULAR | Status: DC | PRN
  Administered 2014-01-09: 0.2 mg via INTRAVENOUS

## 2014-01-09 MED ORDER — ONDANSETRON HCL 4 MG/2ML IJ SOLN
4.0000 mg | Freq: Once | INTRAMUSCULAR | Status: AC
  Administered 2014-01-09: 4 mg via INTRAVENOUS

## 2014-01-09 MED ORDER — LIDOCAINE HCL (CARDIAC) 10 MG/ML IV SOLN
INTRAVENOUS | Status: DC | PRN
  Administered 2014-01-09: 10 mg via INTRAVENOUS

## 2014-01-09 MED ORDER — SUCCINYLCHOLINE CHLORIDE 20 MG/ML IJ SOLN
INTRAMUSCULAR | Status: DC | PRN
  Administered 2014-01-09: 140 mg via INTRAVENOUS

## 2014-01-09 MED ORDER — IOHEXOL 350 MG/ML SOLN
INTRAVENOUS | Status: DC | PRN
  Administered 2014-01-09: 13:00:00

## 2014-01-09 MED ORDER — MIDAZOLAM HCL 2 MG/2ML IJ SOLN
1.0000 mg | INTRAMUSCULAR | Status: DC | PRN
  Administered 2014-01-09 (×2): 2 mg via INTRAVENOUS

## 2014-01-09 MED ORDER — CEFAZOLIN SODIUM-DEXTROSE 2-3 GM-% IV SOLR
2.0000 g | INTRAVENOUS | Status: AC
  Administered 2014-01-09: 2 g via INTRAVENOUS

## 2014-01-09 MED ORDER — ROCURONIUM BROMIDE 100 MG/10ML IV SOLN
INTRAVENOUS | Status: DC | PRN
  Administered 2014-01-09 (×3): 5 mg via INTRAVENOUS

## 2014-01-09 MED ORDER — FENTANYL CITRATE 0.05 MG/ML IJ SOLN
INTRAMUSCULAR | Status: AC
  Filled 2014-01-09: qty 2

## 2014-01-09 MED ORDER — GLUCAGON HCL RDNA (DIAGNOSTIC) 1 MG IJ SOLR
INTRAMUSCULAR | Status: AC
  Administered 2014-01-09 (×8): 0.25 mg via INTRAVENOUS
  Filled 2014-01-09: qty 2

## 2014-01-09 MED ORDER — FENTANYL CITRATE 0.05 MG/ML IJ SOLN: 25.0000 ug | INTRAMUSCULAR | Status: DC | PRN

## 2014-01-09 MED ORDER — BUTAMBEN-TETRACAINE-BENZOCAINE 2-2-14 % EX AERO
INHALATION_SPRAY | CUTANEOUS | Status: AC
  Filled 2014-01-09: qty 56

## 2014-01-09 SURGICAL SUPPLY — 36 items
BAG HAMPER (MISCELLANEOUS) ×3 IMPLANT
BALLN RETRIEVAL 12X15 (BALLOONS) IMPLANT
BALLN RETRIEVAL 12X15MM (BALLOONS)
BASKET TRAPEZOID 3X6 (MISCELLANEOUS) IMPLANT
CATH ACCESS DELIVERY (CATHETERS) IMPLANT
DEVICE INFLATION ENCORE 26 (MISCELLANEOUS) IMPLANT
DEVICE LOCKING W-BIOPSY CAP (MISCELLANEOUS) ×3 IMPLANT
FIBER LASER SLIMLINE GI 365 (MISCELLANEOUS) IMPLANT
FORCEPS BIOP RJ4 240 HOT (CUTTING FORCEPS) IMPLANT
FORCEPS BIOP SPYBITE 1.2X286 (FORCEP) IMPLANT
GUIDEWIRE HYDRA JAGWIRE .35 (WIRE) ×3 IMPLANT
GUIDEWIRE JAG HINI 025X260CM (WIRE) IMPLANT
KIT CLEAN ENDO COMPLIANCE (KITS) ×3 IMPLANT
KIT ROOM TURNOVER APOR (KITS) ×3 IMPLANT
LUBRICANT JELLY 4.5OZ STERILE (MISCELLANEOUS) ×3 IMPLANT
NEEDLE HYPO 18GX1.5 BLUNT FILL (NEEDLE) IMPLANT
NS IRRIG 500ML POUR BTL (IV SOLUTION) IMPLANT
PAD ARMBOARD 7.5X6 YLW CONV (MISCELLANEOUS) ×3 IMPLANT
PATHFINDER 450CM 0.18 (STENTS) IMPLANT
POSITIONER HEAD 8X9X4 ADT (SOFTGOODS) IMPLANT
PROBE VISUALIZATION SPYGLASS (PROBE) IMPLANT
SNARE ROTATE MED OVAL 20MM (MISCELLANEOUS) IMPLANT
SNARE SHORT THROW 13M SML OVAL (MISCELLANEOUS) IMPLANT
SPHINCTEROTOME AUTOTOME .25 (MISCELLANEOUS) IMPLANT
SPHINCTEROTOME HYDRATOME 44 (MISCELLANEOUS) ×6 IMPLANT
SPONGE GAUZE 4X4 12PLY (GAUZE/BANDAGES/DRESSINGS) ×3 IMPLANT
STENT PANCREATIC PIGTAIL 4FX5 (Stent) ×3 IMPLANT
SYR 3ML LL SCALE MARK (SYRINGE) ×3 IMPLANT
SYR 50ML LL SCALE MARK (SYRINGE) ×3 IMPLANT
SYSTEM CONTINUOUS INJECTION (MISCELLANEOUS) ×3 IMPLANT
TUBE IRRIGATION (IRRIGATION / IRRIGATOR) IMPLANT
TUBING ENDO SMARTCAP PENTAX (MISCELLANEOUS) ×3 IMPLANT
TUBING INSUFFLATOR CO2MPACT (TUBING) ×3 IMPLANT
WALLSTENT METAL COVERED 10X60 (STENTS) IMPLANT
WALLSTENT METAL COVERED 10X80 (STENTS) IMPLANT
WATER STERILE IRR 1000ML POUR (IV SOLUTION) ×3 IMPLANT

## 2014-01-09 NOTE — Anesthesia Preprocedure Evaluation (Addendum)
Anesthesia Evaluation  Patient identified by MRN, date of birth, ID band Patient awake    Reviewed: Allergy & Precautions, H&P , NPO status , Patient's Chart, lab work & pertinent test results  Airway Mallampati: II TM Distance: >3 FB     Dental  (+) Teeth Intact   Pulmonary  breath sounds clear to auscultation        Cardiovascular hypertension, Pt. on medications and Pt. on home beta blockers + dysrhythmias Rhythm:Regular Rate:Normal     Neuro/Psych    GI/Hepatic hiatal hernia, PUD, GERD-  Medicated,(+) Hepatitis -, B  Endo/Other    Renal/GU      Musculoskeletal   Abdominal   Peds  Hematology  (+) Blood dyscrasia (Polycythemia Vera), ,   Anesthesia Other Findings   Reproductive/Obstetrics                          Anesthesia Physical Anesthesia Plan  ASA: III  Anesthesia Plan: General   Post-op Pain Management:    Induction: Intravenous  Airway Management Planned: Oral ETT  Additional Equipment:   Intra-op Plan:   Post-operative Plan: Extubation in OR  Informed Consent: I have reviewed the patients History and Physical, chart, labs and discussed the procedure including the risks, benefits and alternatives for the proposed anesthesia with the patient or authorized representative who has indicated his/her understanding and acceptance.     Plan Discussed with:   Anesthesia Plan Comments:        Anesthesia Quick Evaluation

## 2014-01-09 NOTE — Transfer of Care (Signed)
Immediate Anesthesia Transfer of Care Note  Patient: Gregory Patrick  Procedure(s) Performed: Procedure(s) (LRB): ENDOSCOPIC RETROGRADE CHOLANGIOPANCREATOGRAPHY (ERCP) (N/A) PANCREATIC STENT PLACEMENT (N/A) BIOPSY (N/A)  Patient Location: PACU  Anesthesia Type: General  Level of Consciousness: awake  Airway & Oxygen Therapy: Patient Spontanous Breathing and non-rebreather face mask  Post-op Assessment: Report given to PACU RN, Post -op Vital signs reviewed and stable and Patient moving all extremities  Post vital signs: Reviewed and stable  Complications: No apparent anesthesia complications

## 2014-01-09 NOTE — Discharge Instructions (Signed)
Resume usual medications. Clear liquids today than usual diet. No driving for 24 hours. Physician will call with results of biopsy.  Gastrointestinal Endoscopy Care After Refer to this sheet in the next few weeks. These instructions provide you with information on caring for yourself after your procedure. Your caregiver may also give you more specific instructions. Your treatment has been planned according to current medical practices, but problems sometimes occur. Call your caregiver if you have any problems or questions after your procedure. HOME CARE INSTRUCTIONS  If you were given medicine to help you relax (sedative), do not drive, operate machinery, or sign important documents for 24 hours.  Avoid alcohol and hot or warm beverages for the first 24 hours after the procedure.  Only take over-the-counter or prescription medicines for pain, discomfort, or fever as directed by your caregiver. You may resume taking your normal medicines unless your caregiver tells you otherwise. Ask your caregiver when you may resume taking medicines that may cause bleeding, such as aspirin, clopidogrel, or warfarin.  You may return to your normal diet and activities on the day after your procedure, or as directed by your caregiver. Walking may help to reduce any bloated feeling in your abdomen.  Drink enough fluids to keep your urine clear or pale yellow.  You may gargle with salt water if you have a sore throat. SEEK IMMEDIATE MEDICAL CARE IF:  You have severe nausea or vomiting.  You have severe abdominal pain, abdominal cramps that last longer than 6 hours, or abdominal swelling (distention).  You have severe shoulder or back pain.  You have trouble swallowing.  You have shortness of breath, your breathing is shallow, or you are breathing faster than normal.  You have a fever or a rapid heartbeat.  You vomit blood or material that looks like coffee grounds.  You have bloody, black, or tarry  stools. MAKE SURE YOU:  Understand these instructions.  Will watch your condition.  Will get help right away if you are not doing well or get worse. Document Released: 02/10/2004 Document Revised: 12/28/2011 Document Reviewed: 09/28/2011 [facility] Patient Information 2015 [place], [place]. This information is not intended to replace advice given to you by your health care provider. Make sure you discuss any questions you have with your health care provider.  Clear Liquid Diet A clear liquid diet is a short-term diet that is prescribed to provide the necessary fluid and basic energy you need when you can have nothing else. The clear liquid diet consists of liquids or solids that will become liquid at room temperature. You should be able to see through the liquid. There are many reasons that you may be restricted to clear liquids, such as:  When you have a sudden-onset (acute) condition that occurs before or after surgery.  To help your body slowly get adjusted to food again after a long period when you were unable to have food.  Replacement of fluids when you have a diarrheal disease.  When you are going to have certain exams, such as a colonoscopy, in which instruments are inserted inside your body to look at parts of your digestive system. WHAT CAN I HAVE? A clear liquid diet does not provide all the nutrients you need. It is important to choose a variety of the following items to get as many nutrients as possible:  Vegetable juices that do not have pulp.  Fruit juices and fruit drinks that do not have pulp.  Coffee (regular or decaffeinated), tea, or soda at  the discretion of your health care provider.  Clear bouillon, broth, or strained broth-based soups.  High-protein and flavored gelatins.  Sugar or honey.  Ices or frozen ice pops that do not contain milk. If you are not sure whether you can have certain items, you should ask your health care provider. You may also ask your  health care provider if there are any other clear liquid options. Document Released: 06/28/2005 Document Revised: 07/03/2013 Document Reviewed: 05/25/2013 [facility] Patient Information 2015 [place], [place]. This information is not intended to replace advice given to you by your health care provider. Make sure you discuss any questions you have with your health care provider.

## 2014-01-09 NOTE — Anesthesia Postprocedure Evaluation (Signed)
  Anesthesia Post-op Note  Patient: Gregory Patrick  Procedure(s) Performed: Procedure(s): ENDOSCOPIC RETROGRADE CHOLANGIOPANCREATOGRAPHY (ERCP) (N/A) PANCREATIC STENT PLACEMENT (N/A) BIOPSY (N/A)  Patient Location: PACU  Anesthesia Type:General  Level of Consciousness: awake and patient cooperative  Airway and Oxygen Therapy: Patient Spontanous Breathing and non-rebreather face mask  Post-op Pain: none  Post-op Assessment: Post-op Vital signs reviewed, Patient's Cardiovascular Status Stable, Respiratory Function Stable and Patent Airway  Post-op Vital Signs: Reviewed and stable  Last Vitals:  Filed Vitals:   01/09/14 1252  BP: 128/75  Pulse: 80  Temp: 36.8 C  Resp: 16    Complications: No apparent anesthesia complications

## 2014-01-09 NOTE — H&P (Signed)
Gregory Patrick is an 68 y.o. male.   Chief Complaint: patient is here for ERCP. HPI: Patient is 68 year old Caucasian male who presents with elevated transaminases and alkaline phosphatase.he has undergone multiple imaging studies including ultrasound, CT and MRCP. On MRCP is diffuse narrowing of the extrahepatic biliary system except the distal 4 cm of CBD. He also has focal narrowing intrahepatic biliary radicles. He had normal sedimentation rate, ANA, ASMA, AMA and IgG4 levels. He underwent liver biopsy last week raising various possibilities for definite diagnosis cannot be made.he is therefore here for ERCP. He may have [place] he does not have underlying inflammatory bowel disease. He also gives history of intermittent postprandial vomiting like he had last night. Patient was begun on Urso over a week ago.  Past Medical History  Diagnosis Date  . Hyperlipemia   . Hyperglycemia   . Polycythemia   . Iron deficiency anemia   . Erythrocytosis   . Fatty liver   . Elevated transaminase level   . GERD (gastroesophageal reflux disease)   . Weight loss   . Dysphagia   . Diverticulosis     Of Colon,and small intestine  . Obesity   . Bladder outlet obstruction   . Hemorrhoids   . Hypertension     Beign  . Abdominal pain   . Early satiety   . Vomiting   . Jaundice   . 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 Dysrhythmia   . Hiatal hernia   . Arthritis     Past Surgical History  Procedure Laterality Date  . Colonoscopy  07/24/12    Diverticulosis  . Appendectomy    . Cholecystectomy    . Cataract extraction, bilateral    . Colon resection      diverticulitis  . Arthroscopic knee Right   . Transurethral resection of prostate    . Upper gastrointestinal endoscopy      At age 25  . Vertigo      Benign Positional  . Cyst removal hand      Entered in error  . Shoulder open rotator cuff repair Right 2004    Family History  Problem Relation Age of Onset  . Pulmonary embolism  Mother   . Alcohol abuse Father   . Diabetes Sister   . Hypertension Sister   . Healthy Daughter   . Healthy Son    Social History:  reports that he has never smoked. He has never used smokeless tobacco. He reports that he drinks alcohol. His drug history is not on file.  Allergies:  Allergies  Allergen Reactions  . Vytorin [Ezetimibe-Simvastatin] Other (See Comments)    Elevated LFTs  . Biaxin [Clarithromycin] Nausea And Vomiting    Medications Prior to Admission  Medication Sig Dispense Refill  . Cholecalciferol (VITAMIN D3) 1000 UNITS CAPS Take 1,000 Units by mouth every morning.       . Cyanocobalamin (NASCOBAL) 500 MCG/0.1ML SOLN Place 500 mcg into the nose every Sunday.       Marland Kitchen dexlansoprazole (DEXILANT) 60 MG capsule Take 60 mg by mouth every morning.       . ferrous sulfate 325 (65 FE) MG EC tablet Take 325 mg by mouth every other day.       . nebivolol (BYSTOLIC) 2.5 MG tablet Take 2.5 mg by mouth every morning.       . tamsulosin (FLOMAX) 0.4 MG CAPS capsule Take 0.4 mg by mouth every morning.       Marland Kitchen  ursodiol (ACTIGALL) 300 MG capsule Take 1 capsule (300 mg total) by mouth 3 (three) times daily.  90 capsule  5    No results found for this or any previous visit (from the past 48 hour(s)). No results found.  ROS  Blood pressure 128/86, pulse 66, temperature 98.3 F (36.8 C), temperature source Oral, resp. rate 13, SpO2 97.00%. Physical Exam  Constitutional: He appears well-developed and well-nourished.  HENT:  Mouth/Throat: Oropharynx is clear and moist.  Eyes: Conjunctivae are normal. No scleral icterus.  Neck: No thyromegaly present.  Cardiovascular: Normal rate, regular rhythm and normal heart sounds.   No murmur heard. Respiratory: Effort normal and breath sounds normal.  GI: Soft. He exhibits no mass. There is no tenderness.  Liver edge is palpable lateral to midclavicular line and is firm and nontender  Musculoskeletal: He exhibits no edema.   Lymphadenopathy:    He has no cervical adenopathy.  Neurological: He is alert.  Skin: Skin is warm and dry.     Assessment/Plan Cholestatic liver disease. Stricture involving CHD and CBDand focal intrahepatic narrowing. Diagnostic ERCP.  [place] U 01/09/2014, 10:28 AM

## 2014-01-09 NOTE — Op Note (Signed)
ERCP PROCEDURE REPORT  PATIENT:  Gregory Patrick  MR#:  [id number] Birthdate:  July 06, 1946, 68 y.o., male Endoscopist:  Dr. Rogene Houston, MD Referred By:  Dr. Barnabas Lister B. Spainhour, MD Procedure Date: 01/09/2014  Procedure:   ERCP    Indications:  Patient is 68 year old Caucasian male who presents with fatigue nausea and right upper quadrant pain and abnormal LFTs with cholestatic pattern. He has undergone ultrasound CT and MRCP. Imaging studies reveal diffuse narrowing to CHD and CBD with distalmost segment of CBD of normal caliber. He also has focal narrowing involving the intrahepatic radicles. Sedimentation rate SMA, ANA, AMA and IgG4 levels are normal. He underwent liver biopsy last week mild bile duct injury with portal and periportal fibrosis and interface hepatitis. Definite diagnosis of PSC or autoimmune cholangitis could not be made. He is therefore undergoing diagnostic ERCP.              Informed Consent:  The risks, benefits, limitations, alternatives, and mponderable have been reviewed with the patient. I specifically discussed a 1 in 10 chance of pancreatitis, reaction to medications, bleeding, perforation and the possibility of a failed ERCP. Potential for sphincterotomy and stent placement also reviewed. Questions have been answered. All parties agreeable.  Please see history & physical in medical record for more information.  Medications:  *Please see anesthesia record for complete details Patient was also given 2 g of Ancef IV prior to procedure.  Description of procedure:  Procedure performed in the OR. The patient was placed under anesthesia, intubated, and turned into semipermanent position. Therapeutic Pentax video duodenoscope passed through the oropharynx without any difficulty into the esophagus, stomach, and across the pylorus and pull, and descending duodenum.  Initially upper GI tract was evaluated with Q-scope prior to passing duodenoscope.  Findings:  3 short tongues  of salmon color mucosa noted the distal esophagus. Moderate size sliding hiatal hernia. Normal ampulla of Vater. Normal pancreatogram. CBD could not selectively cannulated; only short distal segment filled as contrast was injected into pancreatic duct. Cannulation also not possible with the guidewire in pancreatic duct. 4 French 5 cm long single pigtail pancreatic stent placed for prophylactic purposes.  Therapeutic/Diagnostic Maneuvers Performed:   See above. Biopsy was taken from distal esophagus at the end of the procedure.  Complications:  None   Impression:  Three short tongues of salmon mucosa suspicious for short segment Barrett's. Biopsy taken. Moderate-sized sliding hiatal hernia. Normal pancreatogram. Unsuccessful cannulation of CBD. 4 French 5 cm long single pigtail pancreatic stent placed for prophylaxis.  Recommendations:  Indomethacin suppository 100 mg per rectum given in PACU. Clear liquids today and usual diet starting in a.m. I will be contacting dictation results of esophageal biopsy. Will arrange for referral to tertiary center.  REHMAN,NAJEEB U  01/09/2014  12:59 PM  CC: Dr. Drue Stager, MD & Dr. Rayne Du ref. provider found

## 2014-01-09 NOTE — Anesthesia Procedure Notes (Signed)
Procedure Name: Intubation Date/Time: 01/09/2014 10:51 AM Performed by: Vista Deck Pre-anesthesia Checklist: Patient identified, Patient being monitored, Timeout performed, Emergency Drugs available and Suction available Patient Re-evaluated:Patient Re-evaluated prior to inductionOxygen Delivery Method: Circle System Utilized Preoxygenation: Pre-oxygenation with 100% oxygen Intubation Type: IV induction, Cricoid Pressure applied and Rapid sequence Laryngoscope Size: Miller and 2 Grade View: Grade I Tube type: Oral Tube size: 7.0 mm Number of attempts: 1 Airway Equipment and Method: stylet Placement Confirmation: ETT inserted through vocal cords under direct vision,  positive ETCO2 and breath sounds checked- equal and bilateral Secured at: 21 cm Tube secured with: Tape Dental Injury: Teeth and Oropharynx as per pre-operative assessment

## 2014-01-10 ENCOUNTER — Encounter (HOSPITAL_COMMUNITY): Payer: Self-pay | Admitting: Internal Medicine

## 2014-01-21 ENCOUNTER — Encounter (INDEPENDENT_AMBULATORY_CARE_PROVIDER_SITE_OTHER): Payer: Self-pay | Admitting: *Deleted

## 2014-01-22 ENCOUNTER — Telehealth (INDEPENDENT_AMBULATORY_CARE_PROVIDER_SITE_OTHER): Payer: Self-pay | Admitting: *Deleted

## 2014-01-22 NOTE — Telephone Encounter (Addendum)
April (RN), from [place], states Treysen was scheduled for a total knee replacement that was cancelled due to evelated liver emzyems. Ethelbert had a bowel duct obstruction that has been cleared. They are needing to know if Dr. Laural Golden thinks it is okay for Gregroy to have his knee replacement from a liver stand point. The return phone number is [phone] or [phone].

## 2014-01-23 NOTE — Telephone Encounter (Signed)
Per Dr.Rehman it is okay for the patient to have surgery. They did to be aware that the patient has [place] , and preserved Liver Function  I called the [facility] and spoke with Mendel Ryder ,she ask that a copy of patient's recent notes be faxed to them. This is so that their Physicians involved in patient's surgery will be made aware.  Fax Number is [phone]

## 2014-01-23 NOTE — Telephone Encounter (Signed)
Please make this to the attention of Mendel Ryder.

## 2014-01-23 NOTE — Telephone Encounter (Signed)
Notes have been faxed to [place] (attention [place])

## 2014-02-12 HISTORY — PX: OTHER SURGICAL HISTORY: SHX169

## 2014-02-19 ENCOUNTER — Ambulatory Visit (INDEPENDENT_AMBULATORY_CARE_PROVIDER_SITE_OTHER): Payer: Medicare Other | Admitting: Internal Medicine

## 2014-03-04 ENCOUNTER — Telehealth (INDEPENDENT_AMBULATORY_CARE_PROVIDER_SITE_OTHER): Payer: Self-pay | Admitting: *Deleted

## 2014-03-04 NOTE — Telephone Encounter (Signed)
B[place] said he was told by Dr. Laural Golden to have his CA19-9 drawn at Dr. Sueanne Margarita office at the next apt. His apt with Dr. Luvenia Heller is tomorrow, 03/05/14.  There office said Dr. Laural Golden will need to fax an order with diagnose code listed. [facility]'s fax number is [phone].

## 2014-03-15 ENCOUNTER — Telehealth (INDEPENDENT_AMBULATORY_CARE_PROVIDER_SITE_OTHER): Payer: Self-pay | Admitting: *Deleted

## 2014-03-15 NOTE — Telephone Encounter (Signed)
Gregory Patrick would like to know if he needs to have blood work done before his apt in November. The return phone number is [phone].

## 2014-03-19 ENCOUNTER — Ambulatory Visit (INDEPENDENT_AMBULATORY_CARE_PROVIDER_SITE_OTHER): Payer: Medicare Other | Admitting: Internal Medicine

## 2014-03-20 NOTE — Telephone Encounter (Signed)
I reviewed patient's ov notes and labs and did not see that any labs are requested prior to the ov in November. Patient called and a message was left.

## 2014-04-02 ENCOUNTER — Ambulatory Visit (INDEPENDENT_AMBULATORY_CARE_PROVIDER_SITE_OTHER): Payer: Medicare Other | Admitting: Internal Medicine

## 2014-06-10 ENCOUNTER — Ambulatory Visit (INDEPENDENT_AMBULATORY_CARE_PROVIDER_SITE_OTHER): Payer: Medicare Other | Admitting: Internal Medicine

## 2014-06-10 ENCOUNTER — Encounter (INDEPENDENT_AMBULATORY_CARE_PROVIDER_SITE_OTHER): Payer: Self-pay | Admitting: Internal Medicine

## 2014-06-10 VITALS — BP 128/78 | HR 74 | Temp 97.5°F | Resp 18 | Ht 75.0 in | Wt 222.0 lb

## 2014-06-10 DIAGNOSIS — K2270 Barrett's esophagus without dysplasia: Secondary | ICD-10-CM

## 2014-06-10 DIAGNOSIS — K8301 Primary sclerosing cholangitis: Secondary | ICD-10-CM

## 2014-06-10 DIAGNOSIS — K830 Cholangitis: Secondary | ICD-10-CM

## 2014-06-10 DIAGNOSIS — K219 Gastro-esophageal reflux disease without esophagitis: Secondary | ICD-10-CM

## 2014-06-10 NOTE — Progress Notes (Signed)
Presenting complaint;  Follow-up for [facility] and GERD.  Database;  Patient is 68 year old Caucasian male who was diagnosed with [place] back in July this year when he presented with elevated transaminases with cholestatic pattern with right upper quadrant abdominal pain and fatigue. He had liver biopsy on 12/31/2013 and definite diagnosis of PSC could not be made. Second opinion was obtained from Dr. Jeris Penta of [facility] and she felt patient either has [place] or autoimmune cholangitis. He underwent ERCP by me but barely system could not be visualized and he therefore had ERCP at [facility] and findings are consistent with [place]. Ursodeoxycholic acid was discontinued. He now returns for follow-up visit.   Subjective:  Patient presents for scheduled visit accompanied by his wife. He has no complaints. He states he has not had abdominal pain since his ERCP of July 2015. He denies pruritus fatigue or weakness. He has lost 6 pounds in the last 6 months. He does complain of excessive flatus relieve it Mylicon. He has occasional nausea which he blames on sinus drainage. He generally has 2-3 stools per day and most of his stools are loose. He is using Imodium on as-needed basis. He had single episode of hematochezia felt to be secondary hemorrhoids. He denies frank bleeding or melena. He says heartburns well-controlled with therapy and he denies dysphagia. He had a right knee replacement on 02/12/2014 and he still has residual pain.    Current Medications: Outpatient Encounter Prescriptions as of 06/10/2014  Medication Sig  . Cholecalciferol (VITAMIN D3) 1000 UNITS CAPS Take 1,000 Units by mouth every morning.   . Cyanocobalamin (NASCOBAL) 500 MCG/0.1ML SOLN Place 500 mcg into the nose every Sunday.   Marland Kitchen dexlansoprazole (DEXILANT) 60 MG capsule Take 60 mg by mouth every morning.   . ferrous sulfate 325 (65 FE) MG EC tablet Take 325 mg by mouth every other day.   . nebivolol (BYSTOLIC) 2.5 MG tablet Take 2.5 mg by  mouth every morning.   . [DISCONTINUED] tamsulosin (FLOMAX) 0.4 MG CAPS capsule Take 0.4 mg by mouth every morning.   . [DISCONTINUED] ursodiol (ACTIGALL) 300 MG capsule Take 1 capsule (300 mg total) by mouth 3 (three) times daily. (Patient not taking: Reported on 06/10/2014)     Objective: Blood pressure 128/78, pulse 74, temperature 97.5 F (36.4 C), temperature source Oral, resp. rate 18, height 6\' 3"  (1.905 m), weight 222 lb (100.699 kg). Patient is alert and in no acute distress. Conjunctiva is pink. Sclera is nonicteric Oropharyngeal mucosa is normal. No neck masses or thyromegaly noted. Cardiac exam with regular rhythm normal S1 and S2. No murmur or gallop noted. Lungs are clear to auscultation. Abdomen is full but soft and nontender without splenomegaly. Liver edge is easily palpable below RCM and is not tender.  No LE edema or clubbing noted.   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 He is presently asymptomatic. #2. 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 EGD with biopsy was at the time of ERCP in June 2015. #3. History of colonic polyps. Last colonoscopy was in January 2014. Next colonoscopy due in January 2019. He would like to continue GI follow-up to this office since Dr. Berneta Sages has retired.    Plan:  Patient will go the lab for LFTs. He will call when he needs refill for Dexilant. Office visit in 6 months.

## 2014-06-10 NOTE — Patient Instructions (Signed)
Physician will call with results of blood test when completed. 

## 2014-07-01 ENCOUNTER — Telehealth (INDEPENDENT_AMBULATORY_CARE_PROVIDER_SITE_OTHER): Payer: Self-pay | Admitting: Internal Medicine

## 2014-07-01 NOTE — Telephone Encounter (Signed)
Patient contacted to review results of LFTs performed on 06/25/2014. AST and a LT are up slightly but alkaline phosphatase is stone. Patient has [place]. He does not have underlying inflammatory bowel disease. Will repeat LFTs in 3 months. 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

## 2014-07-08 ENCOUNTER — Telehealth (INDEPENDENT_AMBULATORY_CARE_PROVIDER_SITE_OTHER): Payer: Self-pay | Admitting: *Deleted

## 2014-07-08 DIAGNOSIS — R7401 Elevation of levels of liver transaminase levels: Secondary | ICD-10-CM

## 2014-07-08 DIAGNOSIS — K8301 Primary sclerosing cholangitis: Secondary | ICD-10-CM

## 2014-07-08 DIAGNOSIS — R740 Nonspecific elevation of levels of transaminase and lactic acid dehydrogenase [LDH]: Secondary | ICD-10-CM

## 2014-07-08 DIAGNOSIS — K2270 Barrett's esophagus without dysplasia: Secondary | ICD-10-CM

## 2014-07-08 NOTE — Telephone Encounter (Signed)
Per Dr.Rehman the patient is to have Hepatic in 3 months. This had already been completed prior to the opening of this encounter.

## 2014-07-08 NOTE — Telephone Encounter (Signed)
LFT noted for 10/07/14.

## 2014-07-08 NOTE — Telephone Encounter (Signed)
Per Dr.Rehman the patient will need to have labs drawn in 3 months 

## 2014-07-11 ENCOUNTER — Encounter (INDEPENDENT_AMBULATORY_CARE_PROVIDER_SITE_OTHER): Payer: Self-pay

## 2014-08-29 ENCOUNTER — Other Ambulatory Visit (INDEPENDENT_AMBULATORY_CARE_PROVIDER_SITE_OTHER): Payer: Self-pay | Admitting: *Deleted

## 2014-08-29 ENCOUNTER — Encounter (INDEPENDENT_AMBULATORY_CARE_PROVIDER_SITE_OTHER): Payer: Self-pay | Admitting: *Deleted

## 2014-08-29 DIAGNOSIS — K8301 Primary sclerosing cholangitis: Secondary | ICD-10-CM

## 2014-08-29 DIAGNOSIS — K2270 Barrett's esophagus without dysplasia: Secondary | ICD-10-CM

## 2014-08-29 DIAGNOSIS — R740 Nonspecific elevation of levels of transaminase and lactic acid dehydrogenase [LDH]: Secondary | ICD-10-CM

## 2014-08-29 DIAGNOSIS — R7401 Elevation of levels of liver transaminase levels: Secondary | ICD-10-CM

## 2014-10-08 LAB — HEPATIC FUNCTION PANEL
ALT: 78 U/L — ABNORMAL HIGH (ref 0–53)
AST: 66 U/L — ABNORMAL HIGH (ref 0–37)
Albumin: 3.3 g/dL — ABNORMAL LOW (ref 3.5–5.2)
Alkaline Phosphatase: 345 U/L — ABNORMAL HIGH (ref 39–117)
BILIRUBIN INDIRECT: 0.7 mg/dL (ref 0.2–1.2)
Bilirubin, Direct: 0.3 mg/dL (ref 0.0–0.3)
TOTAL PROTEIN: 7.1 g/dL (ref 6.0–8.3)
Total Bilirubin: 1 mg/dL (ref 0.2–1.2)

## 2014-10-14 ENCOUNTER — Telehealth (INDEPENDENT_AMBULATORY_CARE_PROVIDER_SITE_OTHER): Payer: Self-pay | Admitting: *Deleted

## 2014-10-14 DIAGNOSIS — R7401 Elevation of levels of liver transaminase levels: Secondary | ICD-10-CM

## 2014-10-14 DIAGNOSIS — R740 Nonspecific elevation of levels of transaminase and lactic acid dehydrogenase [LDH]: Principal | ICD-10-CM

## 2014-10-14 NOTE — Telephone Encounter (Signed)
Per Dr.Rehman the patient will need to have labs drawn prior to OV 11/25/14.

## 2014-11-06 ENCOUNTER — Encounter (INDEPENDENT_AMBULATORY_CARE_PROVIDER_SITE_OTHER): Payer: Self-pay | Admitting: *Deleted

## 2014-11-06 ENCOUNTER — Other Ambulatory Visit (INDEPENDENT_AMBULATORY_CARE_PROVIDER_SITE_OTHER): Payer: Self-pay | Admitting: *Deleted

## 2014-11-06 ENCOUNTER — Telehealth (INDEPENDENT_AMBULATORY_CARE_PROVIDER_SITE_OTHER): Payer: Self-pay | Admitting: *Deleted

## 2014-11-06 DIAGNOSIS — R822 Biliuria: Secondary | ICD-10-CM

## 2014-11-06 DIAGNOSIS — R7401 Elevation of levels of liver transaminase levels: Secondary | ICD-10-CM

## 2014-11-06 DIAGNOSIS — R740 Nonspecific elevation of levels of transaminase and lactic acid dehydrogenase [LDH]: Principal | ICD-10-CM

## 2014-11-06 NOTE — Telephone Encounter (Signed)
Had [place] yesterday, no appetite, urine is real dark and no fever. Went to the Urologist this morning and was told he had bile in urine. He is also sore around are his liver area. The return phone number is [phone].

## 2014-11-06 NOTE — Telephone Encounter (Signed)
Per Dr.Rehman the patient will need to have labs drawn. Call in Cipro 500 mg take 1 by mouth twice daily for 1 week. As soon as we get the lab work back , he will be called and he will need to have a office appointment. Medication was called to the patient's pharmacy, [facility]. Patient has been made aware. Patient request that he do his lab work there in [place] at [facility] [employer] ,fax # [phone]. Orders have been faxed to lab.

## 2014-11-08 ENCOUNTER — Ambulatory Visit (HOSPITAL_COMMUNITY)
Admission: RE | Admit: 2014-11-08 | Discharge: 2014-11-08 | Disposition: A | Discharge status: Home / Self Care | Payer: Medicare Other | Source: Ambulatory Visit | Attending: Internal Medicine | Admitting: Internal Medicine

## 2014-11-08 ENCOUNTER — Telehealth (INDEPENDENT_AMBULATORY_CARE_PROVIDER_SITE_OTHER): Payer: Self-pay | Admitting: *Deleted

## 2014-11-08 ENCOUNTER — Other Ambulatory Visit (INDEPENDENT_AMBULATORY_CARE_PROVIDER_SITE_OTHER): Payer: Self-pay | Admitting: Internal Medicine

## 2014-11-08 DIAGNOSIS — R1011 Right upper quadrant pain: Secondary | ICD-10-CM | POA: Diagnosis not present

## 2014-11-08 DIAGNOSIS — K859 Acute pancreatitis, unspecified: Secondary | ICD-10-CM | POA: Insufficient documentation

## 2014-11-08 DIAGNOSIS — R17 Unspecified jaundice: Secondary | ICD-10-CM | POA: Diagnosis not present

## 2014-11-08 LAB — POCT I-STAT CREATININE: Creatinine, Ser: 1 mg/dL (ref 0.50–1.35)

## 2014-11-08 MED ORDER — IOHEXOL 300 MG/ML  SOLN
100.0000 mL | Freq: Once | INTRAMUSCULAR | Status: AC | PRN
  Administered 2014-11-08: 100 mL via INTRAVENOUS

## 2014-11-08 NOTE — Telephone Encounter (Signed)
Lab was called to day to get the results for this patient. Results were reviewed with Dr.Rehman. He is calling the patient.  It was mentioned Abdominal Ultrasound today or tomorrow and OV with Rehman on Monday. Dr.Rehman ask that we wait, as he is going to call the patient.

## 2014-11-12 ENCOUNTER — Encounter (INDEPENDENT_AMBULATORY_CARE_PROVIDER_SITE_OTHER): Payer: Self-pay | Admitting: Internal Medicine

## 2014-11-12 ENCOUNTER — Ambulatory Visit (INDEPENDENT_AMBULATORY_CARE_PROVIDER_SITE_OTHER): Payer: Medicare Other | Admitting: Internal Medicine

## 2014-11-12 VITALS — BP 112/68 | HR 74 | Temp 97.8°F | Resp 18 | Ht 75.0 in | Wt 204.3 lb

## 2014-11-12 DIAGNOSIS — K851 Biliary acute pancreatitis without necrosis or infection: Secondary | ICD-10-CM | POA: Insufficient documentation

## 2014-11-12 DIAGNOSIS — K8301 Primary sclerosing cholangitis: Secondary | ICD-10-CM | POA: Insufficient documentation

## 2014-11-12 DIAGNOSIS — R634 Abnormal weight loss: Secondary | ICD-10-CM

## 2014-11-12 DIAGNOSIS — K830 Cholangitis: Secondary | ICD-10-CM

## 2014-11-12 NOTE — Patient Instructions (Signed)
Can take Imodium OTC 2 mg up to twice daily as needed. Weight check every week. Notify if you have lost more than 5 pounds. Call if abdominal pain recurs or fever greater than 100 F

## 2014-11-12 NOTE — Progress Notes (Signed)
Presenting complaint;  Follow-up for biliary pancreatitis. History of [place].  Subjective:  Patient is 69 year old Caucasian male who was last seen on 06/10/2014 and he was doing well. He called last week stating he was having upper abdominal pain had chills and urine had turned orange and he thought he was jaundice. Lab studies are obtained. Bilirubin was over 5. Serum lipase was over 700. He was felt to have biliary pancreatitis. He was begun on Cipro and underwent abdominopelvic CT on 11/08/2014. The patient in radiology department after he had CT. Patient was advised to stay on liquids for 2 days. He now returns for follow-up visit. Abdominal pain is completely resolved. He hasn't had any more chills. He has history of diarrhea with 2-3 bowel movements per day. He states since he's been antibiotic he's been having normal stools. He has made this observation before that anytime he is on antibiotics his stools become normal with the benefit last for 2 or 3 days. He states Imodium also controls his diarrhea. He has advanced his diet. He still not eating fatty foods. He has lost 18 pounds since his last visit. He is not trying to lose weight.  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       Current Medications: Outpatient Encounter Prescriptions as of 11/12/2014  Medication Sig  . aspirin EC 81 MG tablet Take 81 mg by mouth daily.  . Cholecalciferol (VITAMIN D3) 1000 UNITS CAPS Take 1,000 Units by mouth every morning.   . ciprofloxacin (CIPRO) 500 MG tablet Take 500 mg by mouth 2 (two) times daily.   . Cyanocobalamin (NASCOBAL) 500 MCG/0.1ML SOLN Place 500 mcg into the nose every Sunday.   Marland Kitchen dexlansoprazole (DEXILANT) 60 MG capsule Take 60 mg by mouth every morning.   . ferrous sulfate 325 (65 FE) MG EC tablet Take 325 mg by mouth every other day.   . nebivolol (BYSTOLIC) 2.5 MG tablet Take 2.5 mg by mouth every morning.    No facility-administered encounter  medications on file as of 11/12/2014.     Objective: Blood pressure 112/68, pulse 74, temperature 97.8 F (36.6 C), temperature source Oral, resp. rate 18, height 6\' 3"  (1.905 m), weight 204 lb 4.8 oz (92.67 kg). Patient is alert and in no acute distress. Conjunctiva is pink. Sclera is nonicteric. Oropharyngeal mucosa is normal. No neck masses or thyromegaly noted. Cardiac exam with regular rhythm normal S1 and S2. No murmur or gallop noted. Lungs are clear to auscultation. Abdomen is full and soft and nontender. Liver edge is easily palpable laterally in its firm and nontender. Spleen is not palpable.  No LE edema or clubbing noted.  Labs/studies Results: LFTs from 11/06/2014  Total bilirubin 5.1, AP for 62, AST 82 and ALT 184. Serum albumin 2.7.  Serum lipase 702   Abdominopelvic CT from 11/08/2014 negative for pancreatitis or dilated bile duct. .  Lab data from 11/11/2014  WBC 8.07 H&H 16 and 48.7 and platelet count 239K  Bilirubin 1.6, AP 644, AST 130 and ALT 206. Albumin 3.1.    Assessment:  #1. Biliary pancreatitis. Acute symptoms have resolved. Abdominant pelvic CT last week was negative for dilated bile duct or choledocholithiasis. Given clinical course he had mild pancreatitis. If this recurs he will benefit from biliary sphincterotomy. #2. Primary sclerosing cholangitis. Bilirubin, transaminases and alkaline phosphatase are trending up. This may be due to recent episode of biliary pancreatitis or due to progressive PSC. #3. Weight loss. He has lost  18 pounds in over 5 months. Etiology remains unclear but given history of diarrhea he may have malabsorption. #4.  Diarrhea. He has intermittent diarrhea felt to be due to irritable bowel syndrome. Diarrhea has improved with antibiotic which raises possibility of small bowel bacterial overgrowth.   Plan:  Patient will have LFTs repeated in 4 weeks. Patient will call if he loses another 5 pounds or if abdominal pain  recurs. Imodium OTC 2 mg by mouth twice a day when necessary. Office visit in 6 months.

## 2014-11-13 ENCOUNTER — Telehealth (INDEPENDENT_AMBULATORY_CARE_PROVIDER_SITE_OTHER): Payer: Self-pay | Admitting: *Deleted

## 2014-11-13 NOTE — Telephone Encounter (Signed)
Patient left a message stating that after his OV here yesterday,he went by and picked up a copy of a recent MRI done in [place]. Of his lower back. 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 He states that he has read  Up on this and understands that this can affect the kidney and produce kidney stones.  If Dr.Rehman would like a copy , call  Him at [phone].

## 2014-11-14 NOTE — Telephone Encounter (Signed)
Patient was called,message left with the Dr.Rehman's request.

## 2014-11-14 NOTE — Telephone Encounter (Signed)
Please have them fax copy to were office

## 2014-11-18 ENCOUNTER — Encounter (INDEPENDENT_AMBULATORY_CARE_PROVIDER_SITE_OTHER): Payer: Self-pay

## 2014-11-22 ENCOUNTER — Encounter (INDEPENDENT_AMBULATORY_CARE_PROVIDER_SITE_OTHER): Payer: Self-pay

## 2014-11-25 ENCOUNTER — Ambulatory Visit (INDEPENDENT_AMBULATORY_CARE_PROVIDER_SITE_OTHER): Payer: Self-pay | Admitting: Internal Medicine

## 2014-12-04 ENCOUNTER — Other Ambulatory Visit (INDEPENDENT_AMBULATORY_CARE_PROVIDER_SITE_OTHER): Payer: Self-pay | Admitting: *Deleted

## 2014-12-04 ENCOUNTER — Encounter (INDEPENDENT_AMBULATORY_CARE_PROVIDER_SITE_OTHER): Payer: Self-pay | Admitting: *Deleted

## 2014-12-04 DIAGNOSIS — K851 Biliary acute pancreatitis without necrosis or infection: Secondary | ICD-10-CM

## 2014-12-04 DIAGNOSIS — K8301 Primary sclerosing cholangitis: Secondary | ICD-10-CM

## 2014-12-10 ENCOUNTER — Ambulatory Visit (INDEPENDENT_AMBULATORY_CARE_PROVIDER_SITE_OTHER): Payer: Medicare Other | Admitting: Internal Medicine

## 2015-01-03 ENCOUNTER — Encounter (INDEPENDENT_AMBULATORY_CARE_PROVIDER_SITE_OTHER): Payer: Self-pay

## 2015-01-21 ENCOUNTER — Encounter (INDEPENDENT_AMBULATORY_CARE_PROVIDER_SITE_OTHER): Payer: Self-pay

## 2015-04-07 ENCOUNTER — Telehealth (INDEPENDENT_AMBULATORY_CARE_PROVIDER_SITE_OTHER): Payer: Self-pay | Admitting: *Deleted

## 2015-04-07 DIAGNOSIS — Z8719 Personal history of other diseases of the digestive system: Secondary | ICD-10-CM

## 2015-04-07 DIAGNOSIS — G8929 Other chronic pain: Secondary | ICD-10-CM

## 2015-04-07 DIAGNOSIS — R1011 Right upper quadrant pain: Principal | ICD-10-CM

## 2015-04-07 DIAGNOSIS — R6883 Chills (without fever): Secondary | ICD-10-CM

## 2015-04-07 DIAGNOSIS — K2270 Barrett's esophagus without dysplasia: Secondary | ICD-10-CM

## 2015-04-07 DIAGNOSIS — R112 Nausea with vomiting, unspecified: Secondary | ICD-10-CM

## 2015-04-07 NOTE — Telephone Encounter (Signed)
Patient called and states that this weekend, this has been a constant. His liver hurts and feels swollen, chills, vomits every other time he eats. Very nauseated. Per Dr.Rehman get the following labs, if there is a question have the lab to page me. Patient called and made aware,.

## 2015-04-09 ENCOUNTER — Telehealth (INDEPENDENT_AMBULATORY_CARE_PROVIDER_SITE_OTHER): Payer: Self-pay | Admitting: *Deleted

## 2015-04-09 DIAGNOSIS — K8301 Primary sclerosing cholangitis: Secondary | ICD-10-CM

## 2015-04-09 NOTE — Telephone Encounter (Signed)
Per Dr.Rehman the patient will need to have labs drawn prior to office visit on 05/27/15.

## 2015-04-24 ENCOUNTER — Encounter (INDEPENDENT_AMBULATORY_CARE_PROVIDER_SITE_OTHER): Payer: Self-pay

## 2015-05-08 ENCOUNTER — Other Ambulatory Visit (INDEPENDENT_AMBULATORY_CARE_PROVIDER_SITE_OTHER): Payer: Self-pay | Admitting: *Deleted

## 2015-05-08 ENCOUNTER — Encounter (INDEPENDENT_AMBULATORY_CARE_PROVIDER_SITE_OTHER): Payer: Self-pay | Admitting: *Deleted

## 2015-05-08 DIAGNOSIS — K8301 Primary sclerosing cholangitis: Secondary | ICD-10-CM

## 2015-05-12 ENCOUNTER — Telehealth (INDEPENDENT_AMBULATORY_CARE_PROVIDER_SITE_OTHER): Payer: Self-pay | Admitting: *Deleted

## 2015-05-12 NOTE — Telephone Encounter (Signed)
Please send lab order to [facility] in [place] on [address].

## 2015-05-12 NOTE — Telephone Encounter (Signed)
Lab is noted.

## 2015-05-26 ENCOUNTER — Telehealth (INDEPENDENT_AMBULATORY_CARE_PROVIDER_SITE_OTHER): Payer: Self-pay | Admitting: *Deleted

## 2015-05-26 NOTE — Telephone Encounter (Signed)
Open in error

## 2015-05-27 ENCOUNTER — Encounter (INDEPENDENT_AMBULATORY_CARE_PROVIDER_SITE_OTHER): Payer: Self-pay | Admitting: Internal Medicine

## 2015-05-27 ENCOUNTER — Ambulatory Visit (INDEPENDENT_AMBULATORY_CARE_PROVIDER_SITE_OTHER): Payer: Medicare Other | Admitting: Internal Medicine

## 2015-05-27 VITALS — BP 118/80 | HR 74 | Temp 98.1°F | Resp 18 | Ht 75.0 in | Wt 204.2 lb

## 2015-05-27 DIAGNOSIS — J209 Acute bronchitis, unspecified: Secondary | ICD-10-CM

## 2015-05-27 DIAGNOSIS — K219 Gastro-esophageal reflux disease without esophagitis: Secondary | ICD-10-CM

## 2015-05-27 DIAGNOSIS — K830 Cholangitis: Secondary | ICD-10-CM

## 2015-05-27 DIAGNOSIS — K8301 Primary sclerosing cholangitis: Secondary | ICD-10-CM

## 2015-05-27 MED ORDER — LEVOFLOXACIN 500 MG PO TABS: 500.0000 mg | ORAL_TABLET | Freq: Every day | ORAL | Status: DC

## 2015-05-27 NOTE — Patient Instructions (Signed)
LFTs in 4 weeks. Notify if you experience any side effects with Levaquin. On if you have temp greater than 101 F

## 2015-05-27 NOTE — Progress Notes (Signed)
Presenting complaint;  Follow-up for [place]. Patient complains of productive cough.  Subjective:  Patient is 69 year old Caucasian male who was diagnosed with primary site Rosing cholangitis in July last year presents for scheduled visit. He was last seen in May 2016. He does not feel well. He has right upper quadrant pain once or twice a week. Pain may last for several hours to day and a half. He feels tightness in his right upper quadrant and feels as if his liver is swollen. This pain is never unbearable. He has had chills and on few occasions he's had low-grade fever. He also has noted nausea and intermittent postprandial vomiting but he only vomits clear liquid. He had similar episode after he ate lunch yesterday. He feels heartburn is well controlled with therapy. He denies hematemesis. He has noted orange color to his urine. He is drinking more liquids. He has intermittent postprandial diarrhea. He uses Imodium no more than 2-4 doses per month. His appetite has decreased. He has not lost any weight since his last visit. Last year he lost close to 40 pounds. He states he is not as active as he used to be but he is still playing golf at least once a week. He is getting phlebotomy every 2 months for polycythemia vera. He complains of cough which she had for several days. He has thick sputum. She also feels tickling throat. He denies shortness of breath. He went to see his PCP but he was told to go to urgent care center. He states when Dr.spainhour was working he was getting all the care through their office. He has intermittent pruritus over his back. He says is not intractable. He has been under a lot of stress recently. His daughters going through divorce. He and his wife are helping her during this difficult time. She has four children.   Current Medications: Outpatient Encounter Prescriptions as of 05/27/2015  Medication Sig  . aspirin EC 81 MG tablet Take 81 mg by mouth daily.  .  Cholecalciferol (VITAMIN D3) 1000 UNITS CAPS Take 1,000 Units by mouth every morning.   . Cyanocobalamin (NASCOBAL) 500 MCG/0.1ML SOLN Place 500 mcg into the nose every Sunday.   Marland Kitchen dexlansoprazole (DEXILANT) 60 MG capsule Take 60 mg by mouth every morning.   . ferrous sulfate 325 (65 FE) MG EC tablet Take 325 mg by mouth every other day.   . nebivolol (BYSTOLIC) 2.5 MG tablet Take 2.5 mg by mouth every morning.   . [DISCONTINUED] ciprofloxacin (CIPRO) 500 MG tablet Take 500 mg by mouth 2 (two) times daily.    No facility-administered encounter medications on file as of 05/27/2015.     Objective: Blood pressure 118/80, pulse 74, temperature 98.1 F (36.7 C), temperature source Oral, resp. rate 18, height 6\' 3"  (1.905 m), weight 204 lb 3.2 oz (92.625 kg). Patient is alert and in no acute distress. Conjunctiva is pink. Sclera is mildly icteric Oropharyngeal mucosa is normal. No neck masses or thyromegaly noted. Cardiac exam with regular rhythm normal S1 and S2. No murmur or gallop noted. Lungs are clear to auscultation. Abdomen is symmetrical. On palpation it is soft with palpable spleen tip. Liver edge is 4-5 cm below RCM laterally its firm and mildly tender. No LE edema or clubbing noted.  Labs/studies Results: LFTs from 05/23/2015  Bilirubin 2.9. Direct 2.1.  AP 668, AST 139, ALT 119, total protein 7.0 and albumin 2.7.   LFTs from 04/08/2015 Bilirubin 2.5, direct 1.2 AP 522, AST 89, ALT 95,  total protein 7.2 and albumin 2.8.   Assessment:  #1. Primary Sclerosing Cholangitis. His condition appears to be getting worse as evidenced by rising bilirubin and alkaline phosphatase transaminases and drop in serum albumin. There is no proven role of [place]so or other medications. If he has developed dominant stricture it could be managed endoscopically. If he develops for pruritus will start him on Urso. Will monitor his course over the next one month. If cholestasis gets worse will proceed with  MRCP. #2. Acute bronchitis. #3. 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 Heartburns well controlled with therapy. Intermittent vomiting may be triggered by postnasal drip.   Plan:  LFTs within 1 month. Levaquin 500 mg by mouth daily for 7 days Office visit in 4 months.

## 2015-06-02 ENCOUNTER — Encounter (INDEPENDENT_AMBULATORY_CARE_PROVIDER_SITE_OTHER): Payer: Self-pay

## 2015-06-04 ENCOUNTER — Encounter (INDEPENDENT_AMBULATORY_CARE_PROVIDER_SITE_OTHER): Payer: Self-pay | Admitting: *Deleted

## 2015-06-04 ENCOUNTER — Other Ambulatory Visit (INDEPENDENT_AMBULATORY_CARE_PROVIDER_SITE_OTHER): Payer: Self-pay | Admitting: *Deleted

## 2015-06-04 DIAGNOSIS — K8301 Primary sclerosing cholangitis: Secondary | ICD-10-CM

## 2015-06-24 ENCOUNTER — Telehealth (INDEPENDENT_AMBULATORY_CARE_PROVIDER_SITE_OTHER): Payer: Self-pay | Admitting: *Deleted

## 2015-06-24 NOTE — Telephone Encounter (Signed)
appt made 06/25/2015 at 4:00 patient is aware

## 2015-06-24 NOTE — Telephone Encounter (Signed)
Patient called and had labs today.  He said they would send to [place] this afternoon.  He says he thinks he is jaundice.  They even said something to him about his at lab he looked yello.  He states his urine is dark yellow orange.  Wants to be seen tomorrow.  End of endo?    Please advise   Last Seen 05/27/2015

## 2015-06-24 NOTE — Telephone Encounter (Signed)
Per Dr.Rehman bring patient in office tomorrow afternoon. Reba is contacting patient.

## 2015-06-25 ENCOUNTER — Ambulatory Visit (HOSPITAL_COMMUNITY)
Admission: RE | Admit: 2015-06-25 | Discharge: 2015-06-25 | Disposition: A | Discharge status: Home / Self Care | Payer: Medicare Other | Source: Ambulatory Visit | Attending: Internal Medicine | Admitting: Internal Medicine

## 2015-06-25 ENCOUNTER — Encounter (INDEPENDENT_AMBULATORY_CARE_PROVIDER_SITE_OTHER): Payer: Self-pay | Admitting: Internal Medicine

## 2015-06-25 ENCOUNTER — Other Ambulatory Visit (INDEPENDENT_AMBULATORY_CARE_PROVIDER_SITE_OTHER): Payer: Self-pay | Admitting: Internal Medicine

## 2015-06-25 ENCOUNTER — Ambulatory Visit (INDEPENDENT_AMBULATORY_CARE_PROVIDER_SITE_OTHER): Payer: Medicare Other | Admitting: Internal Medicine

## 2015-06-25 VITALS — BP 110/74 | HR 68 | Temp 97.8°F | Resp 18 | Ht 75.0 in | Wt 196.2 lb

## 2015-06-25 DIAGNOSIS — R1011 Right upper quadrant pain: Secondary | ICD-10-CM | POA: Diagnosis not present

## 2015-06-25 DIAGNOSIS — R05 Cough: Secondary | ICD-10-CM | POA: Insufficient documentation

## 2015-06-25 DIAGNOSIS — R17 Unspecified jaundice: Secondary | ICD-10-CM

## 2015-06-25 DIAGNOSIS — K830 Cholangitis: Secondary | ICD-10-CM | POA: Diagnosis not present

## 2015-06-25 DIAGNOSIS — R059 Cough, unspecified: Secondary | ICD-10-CM

## 2015-06-25 DIAGNOSIS — R918 Other nonspecific abnormal finding of lung field: Secondary | ICD-10-CM | POA: Diagnosis not present

## 2015-06-25 DIAGNOSIS — K449 Diaphragmatic hernia without obstruction or gangrene: Secondary | ICD-10-CM | POA: Diagnosis not present

## 2015-06-25 DIAGNOSIS — K8301 Primary sclerosing cholangitis: Secondary | ICD-10-CM

## 2015-06-25 LAB — POCT I-STAT CREATININE: CREATININE: 0.8 mg/dL (ref 0.61–1.24)

## 2015-06-25 MED ORDER — GADOBENATE DIMEGLUMINE 529 MG/ML IV SOLN
18.0000 mL | Freq: Once | INTRAVENOUS | Status: AC | PRN
  Administered 2015-06-25: 18 mL via INTRAVENOUS

## 2015-06-25 MED ORDER — CIPROFLOXACIN HCL 500 MG PO TABS: 500.0000 mg | ORAL_TABLET | Freq: Two times a day (BID) | ORAL | Status: DC

## 2015-06-25 MED ORDER — SODIUM CHLORIDE 0.9 % IV SOLN
INTRAVENOUS | Status: AC
  Filled 2015-06-25: qty 150

## 2015-06-25 MED ORDER — METHYLPREDNISOLONE 4 MG PO TBPK: ORAL_TABLET | ORAL | Status: DC

## 2015-06-25 NOTE — Progress Notes (Signed)
Presenting complaint;  Persistent cough and jaundice.  Subjective:  Patient is 69 year old Caucasian male who has history of PSC diagnosed about 18 months ago called yesterday that he was jaundiced and requested to be seen. He had  LFTs yesterday as planned. He is accompanied by his wife. He himself noted that he was jaundiced yesterday after shower. Urine was dark over the weekend. He has not experienced fever or chills. He has intermittent right upper quadrant pain which she describes achy feeling. This discomfort has not increased over the last few days. He remains with cough productive of clear sputum. At times cough is so bad that he becomes short of breath. When he was seen in the office 4 weeks ago he was given Levaquin. His cough did not go away completely. He was subsequently seen at urgent care center in [facility] and given Zithromax but his cough has never gone away. He remains with poor appetite and continues to lose weight. He has lost 8 pounds in the last 1 month 26 pounds in the last 13 months. He denies itching. He states heartburn is well controlled with therapy. He is having 2-3 stools per day. He denies clay-colored stools melena or rectal bleeding.    Current Medications: Outpatient Encounter Prescriptions as of 06/25/2015  Medication Sig  . aspirin EC 81 MG tablet Take 81 mg by mouth daily.  . Cholecalciferol (VITAMIN D3) 1000 UNITS CAPS Take 1,000 Units by mouth every morning.   . Cyanocobalamin (NASCOBAL) 500 MCG/0.1ML SOLN Place 500 mcg into the nose every Sunday.   Marland Kitchen dexlansoprazole (DEXILANT) 60 MG capsule Take 60 mg by mouth every morning.   Marland Kitchen Dextromethorphan-Guaifenesin (ROBITUSSIN DM PO) Take by mouth. Patient states that he takes 1 teaspoon at night prn. [employer]  . ferrous sulfate 325 (65 FE) MG EC tablet Take 325 mg by mouth every other day.   . nebivolol (BYSTOLIC) 2.5 MG tablet Take 2.5 mg by mouth every morning.   . [DISCONTINUED] levofloxacin  (LEVAQUIN) 500 MG tablet Take 1 tablet (500 mg total) by mouth daily.   No facility-administered encounter medications on file as of 06/25/2015.     Objective: Blood pressure 110/74, pulse 68, temperature 97.8 F (36.6 C), temperature source Oral, resp. rate 18, height 6\' 3"  (1.905 m), weight 196 lb 3.2 oz (88.996 kg). Patient is alert and in no acute distress. He does not have asterixis. Conjunctiva is pink. Sclera is nonicteric Oropharyngeal mucosa is normal. No neck masses or thyromegaly noted. Cardiac exam with regular rhythm normal S1 and S2. No murmur or gallop noted. Lungs are clear to auscultation. Abdomen is symmetrical, soft and nontender. Spleen tip remains palpable. Liver is firm and mildly tender. Inferior margin is 5-6 cm below RCM just inside the right anterior clavicular line. No LE edema or clubbing noted.  Labs/studies Results: LFTs from 06/24/2015  Total bilirubin 13.9 and direct 10.9, AP 596, AST 128 and ALT 114, total protein 6.7 with albumin of 2.2.   LFTs from 05/23/2015 Total bilirubin 2.9 with direct 2.1, AP 668, AST 113, ALT 190 and albumin was 2.7.  Assessment:  #1. Jaundice in the setting of [place] is very worrisome. He does not appear to be acutely ill. Most likely etiology is obstructive jaundice secondary to biliary strictures. It remains to be seen if process involves multiple intrahepatic ducts or a large duct. Doubt acute cholangitis. #2. Cough of over 2 months duration not responding to antibiotic therapy. Lung exam is not suggestive of as  none. He has known large hiatal hernia typical GERD symptoms are well controlled. It remains to be seen if cough is due to prominent he pulmonary or bronchial process or otherwise.   Plan:  Patient given prescription for Medrol Dosepak(21 tablets). Cipro 500 mg by mouth twice a day for 1 week. PA and left chest film. MRCP today if possible. Patient advised to call if he becomes febrile. I will be contacting  patient with results of the studies and further recommendations.

## 2015-06-25 NOTE — Patient Instructions (Signed)
Patient will call with results of chest x-ray film and MRCP and further recommendations.

## 2015-06-25 NOTE — Addendum Note (Signed)
Addended by: Rogene Houston on: 06/25/2015 04:13 PM   Modules accepted: Orders

## 2015-07-15 ENCOUNTER — Telehealth (INDEPENDENT_AMBULATORY_CARE_PROVIDER_SITE_OTHER): Payer: Self-pay | Admitting: Internal Medicine

## 2015-07-15 ENCOUNTER — Telehealth (INDEPENDENT_AMBULATORY_CARE_PROVIDER_SITE_OTHER): Payer: Self-pay | Admitting: *Deleted

## 2015-07-15 DIAGNOSIS — R600 Localized edema: Secondary | ICD-10-CM

## 2015-07-15 DIAGNOSIS — K8301 Primary sclerosing cholangitis: Secondary | ICD-10-CM

## 2015-07-15 DIAGNOSIS — R531 Weakness: Secondary | ICD-10-CM

## 2015-07-15 MED ORDER — FUROSEMIDE 40 MG PO TABS: 40.0000 mg | ORAL_TABLET | Freq: Every day | ORAL | Status: AC | PRN

## 2015-07-15 MED ORDER — POTASSIUM CHLORIDE CRYS ER 20 MEQ PO TBCR: 20.0000 meq | EXTENDED_RELEASE_TABLET | Freq: Every day | ORAL | Status: DC | PRN

## 2015-07-15 NOTE — Telephone Encounter (Signed)
Per Dr.Rehman the patient will need to have labs drawn. 

## 2015-07-15 NOTE — Telephone Encounter (Signed)
Patient called yesterday stating that he was feeling very weak and he had lower extremity edema. He had gained 5 or 6 pounds since his therapeutic ERCP at [facility]. Begin furosemide 40 mg by mouth daily when necessary and KCl 20 mg by mouth daily when necessary. She will go to the lab for CBC and comprehensive chemistry panel. Will also discuss with Dr. Monica Martinez evaluation for liver transplant.

## 2015-07-15 NOTE — Telephone Encounter (Signed)
Mr. Fadely called saying he wanted to make sure his lab order was sent to [facility] on [address] in [place], [place]. Please give him a call if needed.  Pt's ph# [phone] Thank you.

## 2015-07-15 NOTE — Telephone Encounter (Signed)
Labs have been ordered and faxed to lab in [place].

## 2015-07-15 NOTE — Telephone Encounter (Signed)
Patient was called and he provided the correct number for request to be sent . [phone].

## 2015-07-23 ENCOUNTER — Encounter (INDEPENDENT_AMBULATORY_CARE_PROVIDER_SITE_OTHER): Payer: Self-pay

## 2015-07-24 ENCOUNTER — Encounter (INDEPENDENT_AMBULATORY_CARE_PROVIDER_SITE_OTHER): Payer: Self-pay | Admitting: *Deleted

## 2015-07-24 NOTE — Telephone Encounter (Signed)
This encounter was created in error - please disregard.

## 2015-08-04 ENCOUNTER — Telehealth (INDEPENDENT_AMBULATORY_CARE_PROVIDER_SITE_OTHER): Payer: Self-pay | Admitting: *Deleted

## 2015-08-04 NOTE — Telephone Encounter (Signed)
I will need to talk with Dr.Rehman to make sure which labs he wants.

## 2015-08-04 NOTE — Telephone Encounter (Signed)
Patient has appt 2/2, states he needs labs prior to [place] wants you to fax order to [facility] in [place] on 2/1 to fax# [phone]

## 2015-08-07 ENCOUNTER — Ambulatory Visit (INDEPENDENT_AMBULATORY_CARE_PROVIDER_SITE_OTHER): Payer: Medicare Other | Admitting: Internal Medicine

## 2015-08-08 ENCOUNTER — Telehealth (INDEPENDENT_AMBULATORY_CARE_PROVIDER_SITE_OTHER): Payer: Self-pay | Admitting: *Deleted

## 2015-08-08 DIAGNOSIS — K8301 Primary sclerosing cholangitis: Secondary | ICD-10-CM

## 2015-08-08 NOTE — Telephone Encounter (Signed)
Labs have been sent to the lab.

## 2015-08-08 NOTE — Telephone Encounter (Signed)
Labs are due prior to OV 08/14/15.

## 2015-08-14 ENCOUNTER — Encounter (INDEPENDENT_AMBULATORY_CARE_PROVIDER_SITE_OTHER): Payer: Self-pay | Admitting: Internal Medicine

## 2015-08-14 ENCOUNTER — Ambulatory Visit (INDEPENDENT_AMBULATORY_CARE_PROVIDER_SITE_OTHER): Payer: Medicare Other | Admitting: Internal Medicine

## 2015-08-14 VITALS — BP 116/72 | HR 66 | Temp 97.8°F | Resp 18 | Ht 75.0 in | Wt 199.3 lb

## 2015-08-14 DIAGNOSIS — R188 Other ascites: Secondary | ICD-10-CM

## 2015-08-14 DIAGNOSIS — K8301 Primary sclerosing cholangitis: Secondary | ICD-10-CM

## 2015-08-14 DIAGNOSIS — K830 Cholangitis: Secondary | ICD-10-CM | POA: Diagnosis not present

## 2015-08-14 NOTE — Patient Instructions (Signed)
Please call if you have any questions.

## 2015-08-14 NOTE — Progress Notes (Signed)
Presenting complaint;  Follow-up for PSC and ascites.  Subjective:  Patient is 70 year old Caucasian male who was diagnosed with [place] in June 2015 and was last seen on 06/25/2015 when he was jaundice with bilirubin of around 16 mg. MRCP was abnormal and he was sent to Dr. Gayleen Orem of [facility]. He had ERCP with biliary stenting with resolution of his jaundice. He was also referred to Dr. Monica Martinez for transplant evaluation. Patient was seen by him on 2015-09-02. Patient is relocating to [place], [place] and wanted to keep his appointment today. He states he began to have abdominal distention after ERCP with stenting. Dr. Monica Martinez asked him to take furosemide 40 mg daily and added spironolactone 100 mg daily. He states he has lost 45 pounds in the last 4 days. He is watching salt intake. He continues complain of pain in right flank region which is worse after meals and goes away after he scratches his back. His appetite is fair. He feels fatigued but tries to stay busy. Heartburn is well controlled with therapy. He has one to 2 bowel movements per day. Most of his stools are small but formed. He denies melena or rectal bleeding. Last colonoscopy was in January 2014. He will pass this information to Dr. Monica Martinez. Patient is scheduled for MRCP later this month and he is to have stent removed in March. He says Dr. Monica Martinez will plan to see him at [facility] liver clinic.   Current Medications: Outpatient Encounter Prescriptions as of 08/14/2015  Medication Sig  . aspirin EC 81 MG tablet Take 81 mg by mouth daily.  . Cholecalciferol (VITAMIN D3) 1000 UNITS CAPS Take 1,000 Units by mouth every morning.   . Cyanocobalamin (NASCOBAL) 500 MCG/0.1ML SOLN Place 500 mcg into the nose every Sunday.   Marland Kitchen dexlansoprazole (DEXILANT) 60 MG capsule Take 60 mg by mouth every morning.   . ferrous sulfate 325 (65 FE) MG EC tablet Take 325 mg by mouth every other day.   . furosemide (LASIX) 40 MG tablet Take 1 tablet (40 mg  total) by mouth daily as needed.  . nebivolol (BYSTOLIC) 2.5 MG tablet Take 2.5 mg by mouth every morning.   Marland Kitchen spironolactone (ALDACTONE) 100 MG tablet Take 100 mg by mouth.  . [DISCONTINUED] ciprofloxacin (CIPRO) 500 MG tablet Take 1 tablet (500 mg total) by mouth 2 (two) times daily. (Patient not taking: Reported on 08/14/2015)  . [DISCONTINUED] Dextromethorphan-Guaifenesin (ROBITUSSIN DM PO) Take by mouth. Reported on 08/14/2015  . [DISCONTINUED] methylPREDNISolone (MEDROL DOSEPAK) 4 MG TBPK tablet Use as directed (Patient not taking: Reported on 08/14/2015)  . [DISCONTINUED] potassium chloride SA (K-DUR,KLOR-CON) 20 MEQ tablet Take 1 tablet (20 mEq total) by mouth daily as needed. (Patient not taking: Reported on 08/14/2015)   No facility-administered encounter medications on file as of 08/14/2015.     Objective: Blood pressure 116/72, pulse 66, temperature 97.8 F (36.6 C), temperature source Oral, resp. rate 18, height 6\' 3"  (1.905 m), weight 199 lb 4.8 oz (90.402 kg). Patient is alert and in no acute distress. He does not have asterixis. Conjunctiva is pink. Sclera is mildly icteric Oropharyngeal mucosa is normal. No neck masses or thyromegaly noted. Cardiac exam with regular rhythm normal S1 and S2. No murmur or gallop noted. Lungs are clear to auscultation. Abdomen is protuberant. On palpation is soft. Liver edge is palpable below RCM minutes form. Shifting dullness is present.  Trace edema noted around ankles.  Labs/studies Results: Blood work from 09/02/15 reviewed AFP 2.09 WBC 7.2,  H&H 14.4 and 44.9 and platelet count 272K BUN 17 creatinine 0.73 bilirubin 3.4 direct 2.2, AP 2084, AST 119, ALT 83 and albumin 3.0.   Assessment:  #1. Primary sclerosing cholangitis. PSC was diagnosed 18 months ago. His disease has progressed. He underwent biliary stenting for stricture but 6 weeks ago. Hepatic function has improved since biliary decompression but cholestasis has not resolved.  Alkaline phosphatase keeps rising. Dr. Monica Martinez feels that he may not need liver transplant for another 3-4 years and by that time he would not be a candidate for liver transplant. Nevertheless he will continue to be followed at [facility]. #2. Ascites secondary to [place]. He seems to be responding to diuretic therapy. He is scheduled to have metabolic 7 weeks week when he has MRCP. #3. GERD complicated by Barrett's esophagus. Heartburn is well controlled with therapy. #4. He is up-to-date for CRC screening. Last colonoscopy was in January 2014.   Plan:  Patient will continue current dose of furosemide and spironolactone. He will continue follow-up with Dr. Monica Martinez who is also in the process of arranging local GI coverage. Patient will return for office on as-needed basis.

## 2015-09-02 ENCOUNTER — Ambulatory Visit (INDEPENDENT_AMBULATORY_CARE_PROVIDER_SITE_OTHER): Payer: Medicare Other | Admitting: Internal Medicine

## 2015-09-30 ENCOUNTER — Ambulatory Visit (INDEPENDENT_AMBULATORY_CARE_PROVIDER_SITE_OTHER): Payer: Medicare Other | Admitting: Internal Medicine

## 2015-11-14 IMAGING — US IR US GUIDANCE
1 series · 2 of 2 positions shown · non-contrast
Comparison: none

CLINICAL DATA: Elevated transaminase level

EXAM:
ULTRASOUND-GUIDED CORE LIVER BIOPSY
TECHNIQUE: An ultrasound guided liver biopsy was thoroughly discussed with the
patient and questions were answered. The benefits, risks,
alternatives, and complications were also discussed. The patient
understands and wishes to proceed with the procedure. A verbal as
well as written consent was obtained.

[Series 1: ir us guidance · 2 of 2 slices shown]
[im 1/2]
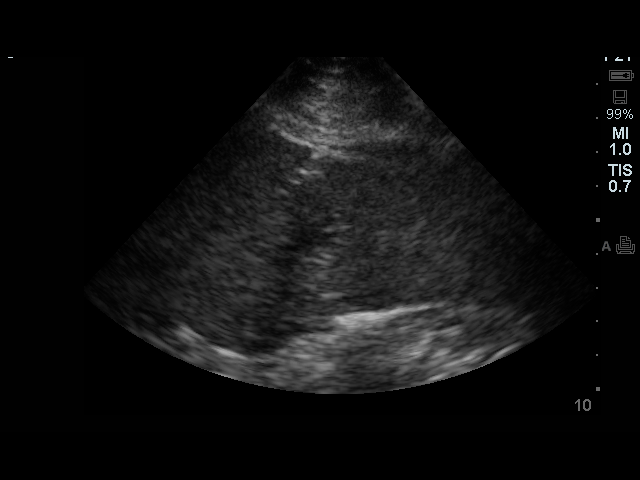
[im 2/2]
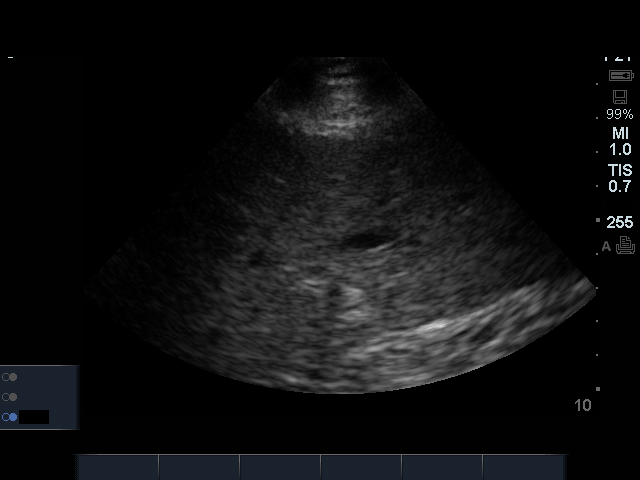

[2 of 2 positions shown; findings below may reference images not displayed]

Survey ultrasound of the liver was performed and an appropriate skin
entry site was determined. Skin site was marked, prepped with
Betadine, and draped in usual sterile fashion, and infiltrated
locally with 1% lidocaine.

Intravenous Fentanyl and Versed were administered as conscious
sedation during continuous cardiorespiratory monitoring by the
radiology RN, with a total moderate sedation time of less than 30
minutes.

A 17 gauge trocar needle was advanced under ultrasound guidance into
the liver. 4 coaxial 41gauge core samples were then obtained through
the guide needle. The guide needle was removed. Post procedure scans
demonstrate no apparent complication.
FINDINGS: Survey images of the liver are unremarkable. Ultrasound-guided core
biopsy performed.
IMPRESSION: 1. Technically successful ultrasound guided core liver biopsy.

## 2016-02-02 ENCOUNTER — Encounter (INDEPENDENT_AMBULATORY_CARE_PROVIDER_SITE_OTHER): Payer: Self-pay

## 2016-05-12 DEATH — deceased

## 2017-05-08 IMAGING — DX DG CHEST 2V
2 series · 2 of 2 positions shown · non-contrast
Comparison: None.

CLINICAL DATA: Productive cough with intermittent shortness of
breath for 8 weeks, diagnosed with bronchitis 8 weeks ago

EXAM:
CHEST  2 VIEW

[chest pa]
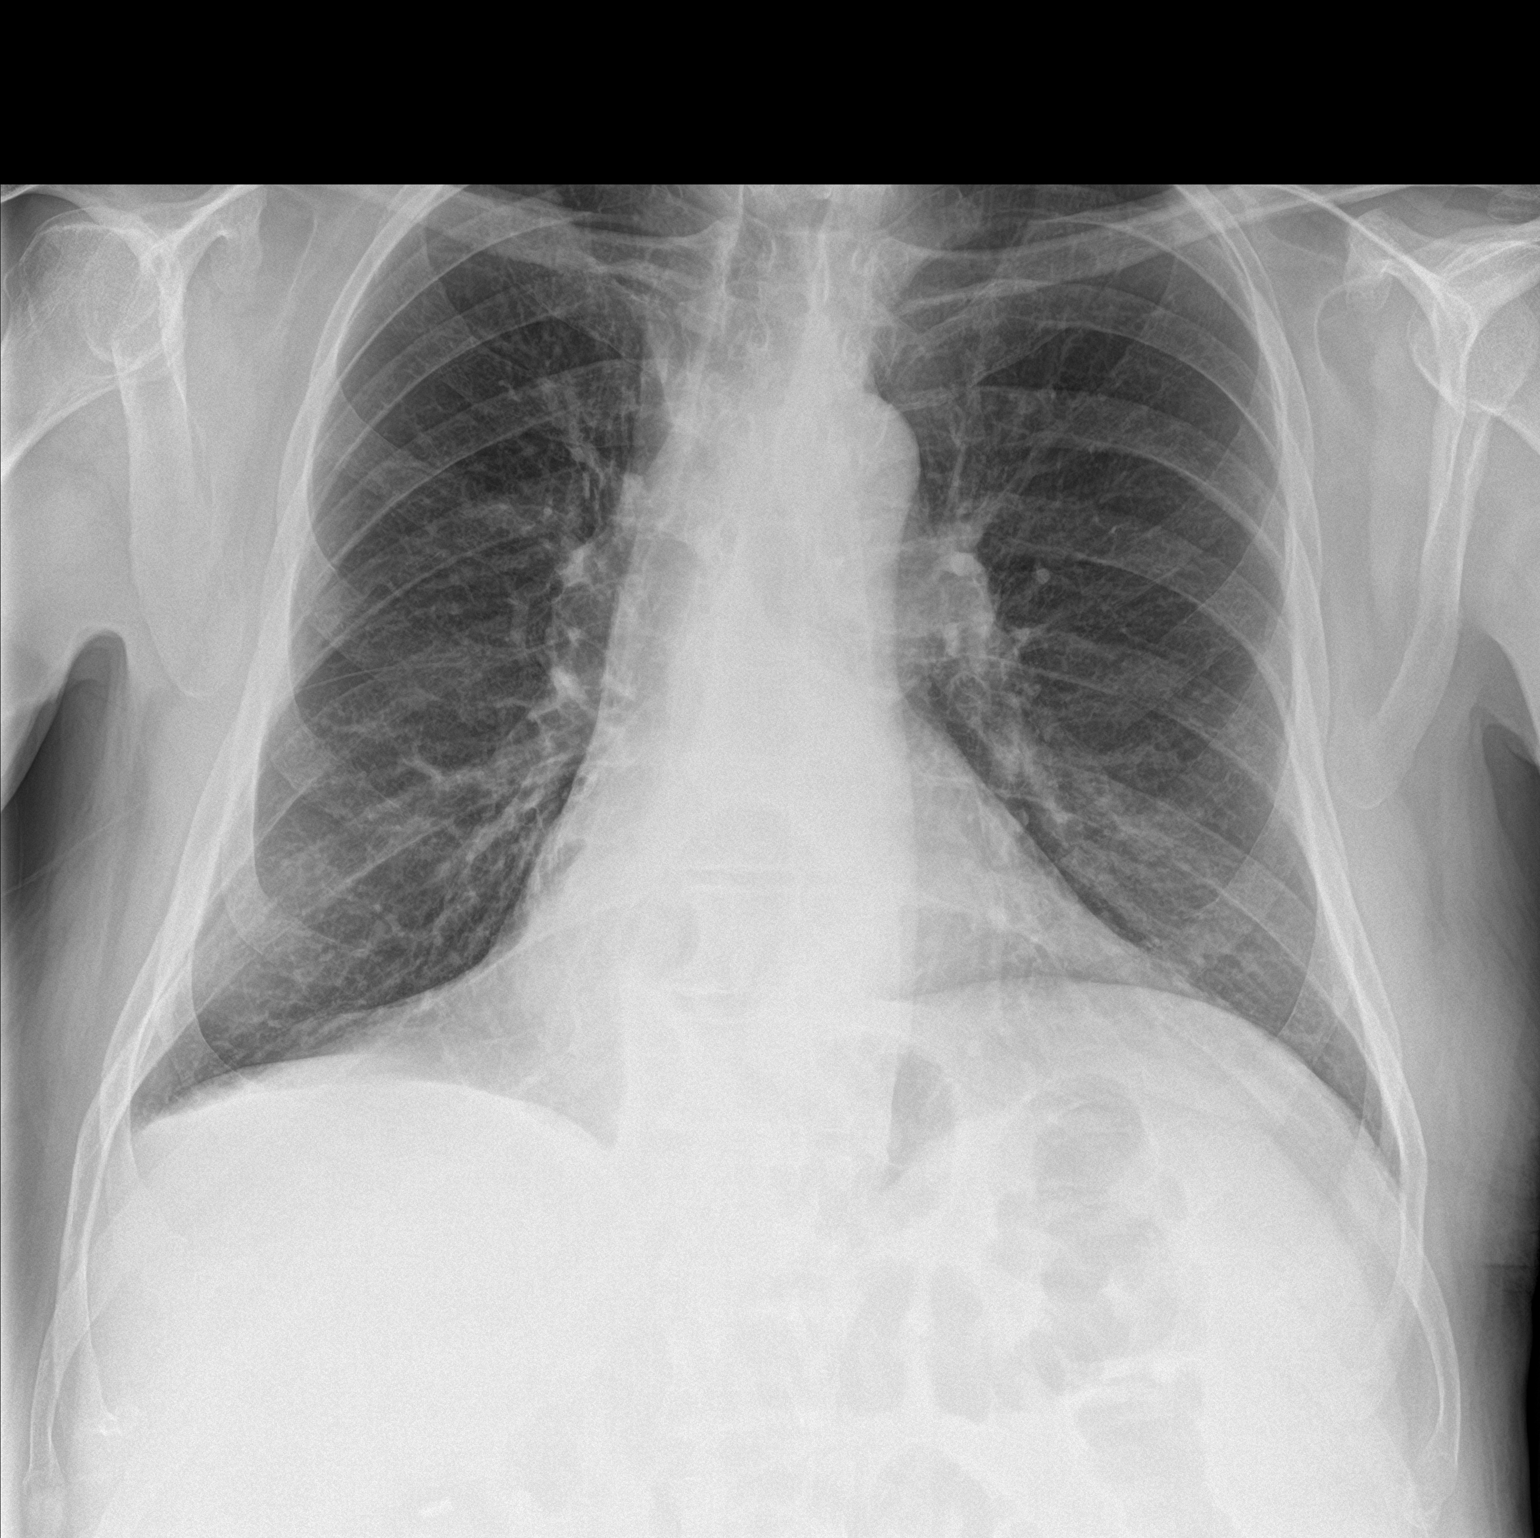

[chest lat]
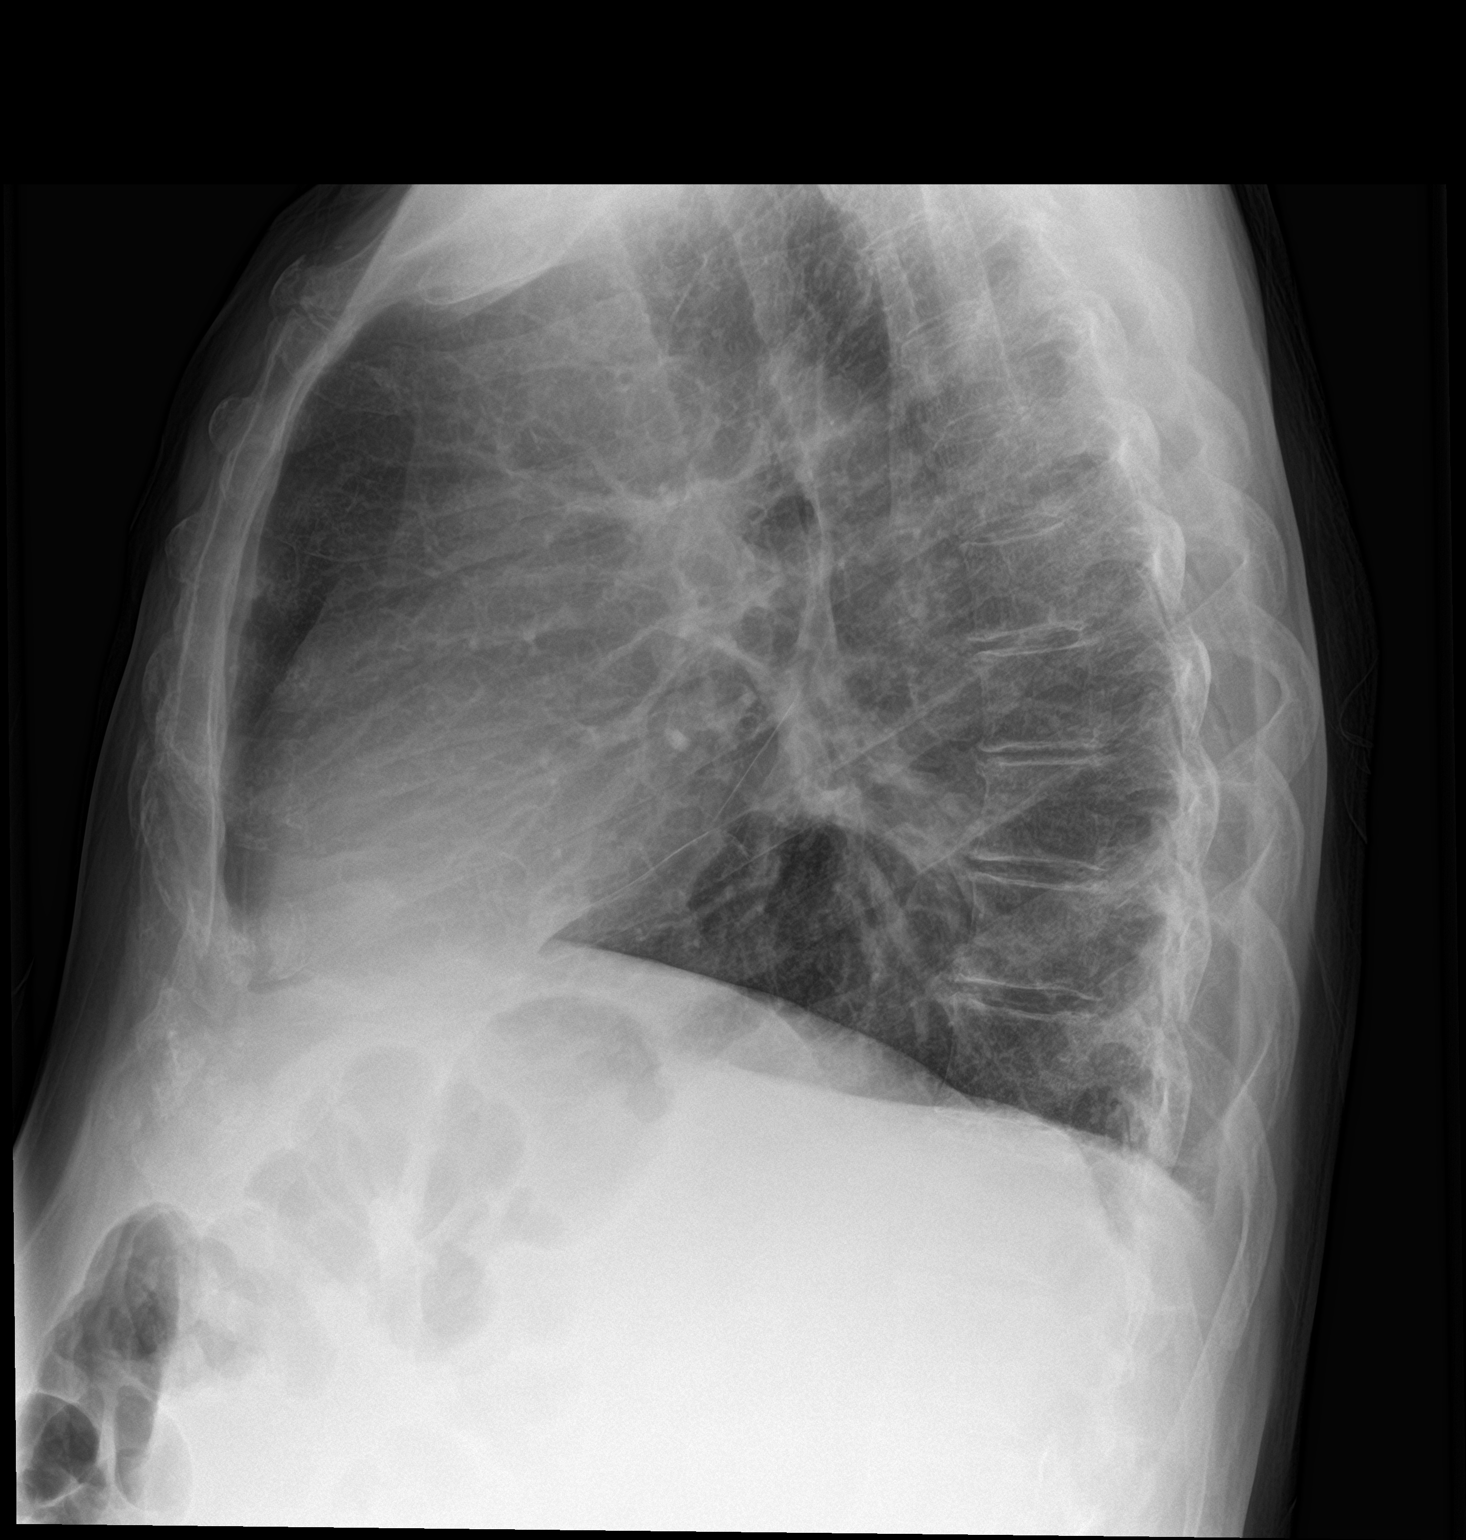

[2 of 2 positions shown; findings below may reference images not displayed]

FINDINGS: Normal heart size, mediastinal contours and pulmonary vascularity.

Lungs hyperinflated but clear.

No pulmonary infiltrate, pleural effusion or pneumothorax.

Osseous structures unremarkable.
IMPRESSION: Hyperinflated lungs without acute infiltrate.
